# Patient Record
Sex: Male | Born: 1965 | Race: White | Hispanic: No | Marital: Married | Smoking: Never smoker
Health system: Southern US, Community
[De-identification: ages and names within clinical notes are randomized; demographics above are authoritative.]

## PROBLEM LIST (undated history)

## (undated) DIAGNOSIS — I10 Essential (primary) hypertension: Secondary | ICD-10-CM

## (undated) DIAGNOSIS — E785 Hyperlipidemia, unspecified: Secondary | ICD-10-CM

## (undated) DIAGNOSIS — F419 Anxiety disorder, unspecified: Secondary | ICD-10-CM

## (undated) DIAGNOSIS — K648 Other hemorrhoids: Secondary | ICD-10-CM

## (undated) DIAGNOSIS — K219 Gastro-esophageal reflux disease without esophagitis: Secondary | ICD-10-CM

## (undated) DIAGNOSIS — IMO0002 Reserved for concepts with insufficient information to code with codable children: Secondary | ICD-10-CM

## (undated) DIAGNOSIS — K759 Inflammatory liver disease, unspecified: Secondary | ICD-10-CM

## (undated) DIAGNOSIS — N400 Enlarged prostate without lower urinary tract symptoms: Secondary | ICD-10-CM

## (undated) HISTORY — DX: Hyperlipidemia, unspecified: E78.5

## (undated) HISTORY — PX: OTHER SURGICAL HISTORY: SHX169

## (undated) HISTORY — DX: Benign prostatic hyperplasia without lower urinary tract symptoms: N40.0

## (undated) HISTORY — DX: Inflammatory liver disease, unspecified: K75.9

## (undated) HISTORY — DX: Essential (primary) hypertension: I10

## (undated) HISTORY — PX: HAND SURGERY: SHX662

## (undated) HISTORY — DX: Gastro-esophageal reflux disease without esophagitis: K21.9

## (undated) HISTORY — DX: Other hemorrhoids: K64.8

## (undated) HISTORY — PX: KNEE ARTHROSCOPY: SUR90

## (undated) HISTORY — DX: Anxiety disorder, unspecified: F41.9

## (undated) HISTORY — PX: TONSILLECTOMY: SUR1361

## (undated) HISTORY — DX: Reserved for concepts with insufficient information to code with codable children: IMO0002

---

## 2003-01-22 ENCOUNTER — Ambulatory Visit (HOSPITAL_COMMUNITY): Admission: RE | Admit: 2003-01-22 | Discharge: 2003-01-22 | Payer: Self-pay | Admitting: Family Medicine

## 2003-02-09 ENCOUNTER — Ambulatory Visit (HOSPITAL_COMMUNITY): Admission: RE | Admit: 2003-02-09 | Discharge: 2003-02-09 | Payer: Self-pay | Admitting: Internal Medicine

## 2006-04-22 ENCOUNTER — Ambulatory Visit: Payer: Self-pay | Admitting: Internal Medicine

## 2006-05-31 ENCOUNTER — Ambulatory Visit: Payer: Self-pay | Admitting: Internal Medicine

## 2006-06-07 ENCOUNTER — Ambulatory Visit: Payer: Self-pay | Admitting: Internal Medicine

## 2010-06-02 NOTE — Op Note (Signed)
NAME:  Benjamin Mcknight, Benjamin Mcknight                        ACCOUNT NO.:  [id number]   MEDICAL RECORD NO.:  [id number]                   PATIENT TYPE:  AMB   LOCATION:  DAY                                  FACILITY:  [facility]   PHYSICIAN:  R. Roetta Sessions, M.D.              DATE OF BIRTH:  1965-07-26   DATE OF PROCEDURE:  DATE OF DISCHARGE:                                 OPERATIVE REPORT   PROCEDURE:  Diagnostic esophagogastroduodenoscopy.   ENDOSCOPIST:  Gerrit Friends. Rourk, M.D.   INDICATIONS FOR PROCEDURE:  The patient is a 45 year old male with symptoms  consistent with gastroesophageal reflux disease.  He has had some  improvement with a variety of proton pump inhibitors.  He is more recently  on Zegerid 20 mg daily, but continues to have frequent breakthrough  symptoms.  The gallbladder ultrasound was negative.  He does not have any  alarm symptoms such as GI bleeding, odynophagia, dysphagia, early satiety,  or weight loss.  EGD is now being done to further evaluate his symptoms.  This approach has been discussed with the patient at length.  The potential  risks, benefits, and alternatives have been reviewed; questions answered.  Please see my dictated H&P for more information.   PROCEDURE NOTE:  O2 saturation, blood pressure, pulse and respirations were  monitored throughout the entirety procedure.  Conscious sedation: Versed 3  mg IV, Demerol 75 mg IV in divided doses.   INSTRUMENT:  Olympus video chip GIF-140 gastroscope.   FINDINGS:  Examination of the tubular esophagus revealed no mucosal  abnormalities.  There was specifically no evidence of esophagitis or  Barrett's esophagus.  The EG junction was somewhat patulous and very easily  traversed.   STOMACH:  The gastric cavity was empty.  It insufflated well with air.  A  thorough examination of the gastric mucosa including a retroflex view of the  proximal stomach and esophagogastric junction demonstrated no abnormalities.  The pylorus  was patent and easily traversed.   DUODENUM:  The bulb and the second portion revealed no abnormalities.   THERAPEUTIC/DIAGNOSTIC MANEUVERS:  None.   The patient tolerated the procedure well and was reacted in endoscopy.   IMPRESSION:  1. Normal esophageal mucosa.  2. Patulous EG junction and normal stomach, D1 and D2.   RECOMMENDATIONS:  Increased Zegerid to 20 mg orally b.i.d. before breakfast  and supper.  Will see him back in the office in 1 month.  If his reflux  symptoms are significantly improved at that time he will need esophageal  manometry coupled with an ambulatory esophageal pH probe study while on acid  suppression therapy.  Further recommendations to follow.      ___________________________________________  Benjamin Mcknight, M.D.   RMR/MEDQ  D:  02/09/2003  T:  02/09/2003  Job:  [id number]   cc:   Benjamin Mcknight, M.D.  [address]  [place]  [place] [address]  Fax: [phone]

## 2010-06-02 NOTE — Assessment & Plan Note (Signed)
[facility]                         GASTROENTEROLOGY OFFICE NOTE   NAME:Advani, LERRY CORDREY                     MRN:          [id number]  DATE:04/22/2006                            DOB:          16-Feb-1965    Mr. Benassi is a very nice 45 year old patient of Dr. Phillips Odor who is here  today to evaluate intermittent rectal bleeding.  For several years he  says off and on painless hematochezia usually with bowel movements.  Blood is on the toilet tissue and is small amount only on 1 or 2  occasions was there a significant amount of blood, but never more than a  tablespoon.  He works as a Medical illustrator.  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  He has had prostatitis  apparently attributed to him sitting in the car a lot.  He also was  evaluated for gastroesophageal reflux by Dr. Jena Gauss in 2005 with findings  of hiatal hernia.  He has been on PPI Protonix 2 or 3 times a week.  As  far as his bowel habits are concerned, he has a bowel movement every  morning, sometimes rather hard.  He has to force.  He has never had any  hemorrhoids, but was diagnosed with rectal tissue by Dr. Sherwood Gambler several  years ago.  He was treated with suppositories and bleedings and fissure  subsided.  There is no family history of inflammatory bowel disease,  colon cancer or colon polyps.  In the last several months he has had 2  episodes of MRSA related perirectal abscess which was drained by Dr.  Sharlet Salina T. Hoxworth.  Etiology really is not clear, he is well now.  Not having any rectal pain.  His weight has been stable.  He has had  intentional weight loss about 5 pounds recently.   MEDICATIONS:  1. Bisoprolol.  2. HCTZ 5/6.25 mg once daily.  3. Protonix 40 mg 3 times a week.  4. Simvastatin 10 mg p.o. nightly.  5. Fish oil.  6. __________ 1 daily.   PAST MEDICAL HISTORY:  High blood pressure, high cholesterol, anxiety,  history of  prostatitis and MRSA related to anal abscess.   OPERATIONS:  Tongue surgery 1980 and tonsillectomy.  He also had left  knee surgery in 1992.   FAMILY HISTORY:  Negative for Crohn's disease or colon cancer.   SOCIAL HISTORY:  Married with 2 children.  Works in Airline pilot.  He is a  Engineer, maintenance (IT).  He drinks alcohol socially, but does not smoke.   REVIEW OF SYSTEMS:  Positive for recent fever.   ALLERGIES:  Skin rashes, hearing problems and swollen glands.   PHYSICAL EXAMINATION:  Blood pressure 122/62, pulse 80 and weight 178  pounds.  He appears healthy, no distress.  LUNGS:  Clear to auscultation.  NECK:  Supple without adenopathy.  HEART:  Quiet pericardium, normal S1, normal S2.  ABDOMEN:  Soft, nontender with normal active bowel sounds, good muscular  support. Liver edge was at costal margin.  RECTAL AND ANOSCOPIC:  Shows normal perianal area.  I  could not detect  any scar tissue from the recently drained perianal abscess.  Rectal tone  was increased, I think that the patient was somewhat anxious.  Anal  cannel itself was normal.  There was very prominent dentate line with an  overgrown papillae with some small bluish hemorrhoids proximal to the  dentate line, but 2 or 3 of them were identified, but none of them were  bleeding.  There was no evidence of fissure, although the dentate line  was somewhat irregular.  There was no stool to check for hemoccult, no  other lesions were noted.   IMPRESSION:  A 45 year old white male with intermittent rectal bleeding  with small hemorrhoids and a history of anal fissure.  He has no risk  factors for colon cancer.  The bleeding seems to be associated with hard  bowel movements.  I think he is very low risk for colon cancer or for  colon polyps.  Since he is only 76, his colorectal screening is not  mandatory.   PLAN:  I have discussed 2 options with the patient, one would be to go  ahead and schedule colonoscopy to rule out bleeding  proximal to his  rectal sigmoid.  The other plan would be to go ahead and treat him for  his small hemorrhoids, soften his stools, make sure he drinks a lot of  liquids and wait about 6-8 weeks treating with Anusol-HC suppositories.  If the bleeding continues despite of these modifications he will call us  to schedule direct colonoscopy.  If the bleeding stops, I think he ought  to have a colonoscopy when he is 45 years old.     Hedwig Morton. Juanda Chance, MD  Electronically Signed    DMB/MedQ  DD: 04/22/2006  DT: 04/22/2006  Job #: [id number]   cc:   Corrie Mckusick, M.D.

## 2010-06-02 NOTE — Consult Note (Signed)
NAME:  Benjamin Mcknight, ROMULUS                          ACCOUNT NO.:  [id number]   MEDICAL RECORD NO.:  [id number]                  PATIENT TYPE:   LOCATION:                                       FACILITY:   PHYSICIAN:  R. Roetta Sessions, M.D.              DATE OF BIRTH:  08-08-1965   DATE OF CONSULTATION:  02/04/2003  DATE OF DISCHARGE:                                   CONSULTATION   GASTROINTESTINAL CONSULTATION   REQUESTING PHYSICIAN:  Corrie Mckusick, M.D.   REASON FOR CONSULTATION:  Persistent GERD.   HISTORY OF PRESENT ILLNESS:  This is a 45 year old Caucasian gentleman who  has a history of chronic gastroesophageal reflux disease.  He underwent EGD  about 12 years ago while living in [place], [place] and reports an  unremarkable study.  Six years ago, however, he states that he had an upper  GI series which revealed a moderate sized hiatal hernia.  At that time he  was also having similar symptoms that he is now. He was on a PPI for several  months, but was able to come off medication and did fairly well up until a  couple of years ago.  For the last 2 years he has been on Aciphex 20 mg  daily.  Two months ago he started having severe right __________ symptoms.  He complains of chest pain in the substernal region associated with  indigestion.  He feels like there is a pressure in his chest often relieved  by belching.  He denies any dysphagia or odynophagia.  His appetite has been  good.  He denies any abdominal pain.  At one point he was on Aciphex and  Prevacid together, but this did not control his symptoms.  He had positive  H. pylori serologies and underwent treatment.  For the last 1 month he has  been on Zegerid with no noted improvement either.  He tends to have severe  symptoms every 3 to 4 days.  He occasionally does have some nocturnal  symptoms.  Symptoms seem to be worse if he has an empty stomach.  He denies  any constipation, diarrhea, melena or rectal  bleeding.  He had an abdominal  ultrasound on January 22, 2003 which was negative.   CURRENT MEDICATIONS:  1. Zegerid 20 mg daily.  2. Ziac 5 mg daily.  3. Saw palmetto 4 daily   ALLERGIES:  No known drug allergies.   PAST MEDICAL HISTORY:  1. Hypertension.  2. History of hiatal hernia.   PAST SURGICAL HISTORY:  1. Left knee arthroscopy.  2. Tonsillectomy.  3. Right lung surgery.   FAMILY HISTORY:  Negative for chronic GI illnesses or colorectal cancer.   SOCIAL HISTORY:  Married for 12 years has 2 children.  He is self-employed,  Chief Strategy Officer.  He has never been a smoker.  He has had no  alcohol in the last 4 weeks;  however, previously drank about 3-5 beers on  the weekends.   REVIEW OF SYSTEMS:  Please see HPI for GI.  GENERAL:  Denies any weight  loss.  CARDIOPULMONARY:  Denies any shortness of breath or exertional chest  pain.  No diaphoresis or palpitations   PHYSICAL EXAMINATION:  VITAL SIGNS:  Weight 184-1/2.  Height 6 feet.  Temperature 97.9, blood pressure 140/70, pulse 70.  GENERAL:  A pleasant, well-nourished, well-developed, Caucasian male in no  acute distress.  SKIN:  Warm and dry.  No jaundice.  HEENT:  Conjunctivae are pink.  Sclerae anicteric. Oropharyngeal mucosa  moist and pink.  No lesions, erythema or exudate.  No lymphadenopathy or  thyromegaly.  CHEST:  Lungs are clear to auscultation.  CARDIOVASCULAR:  Cardiac exam reveals regular rate and rhythm.  Normal S1,  S2.  No murmurs, rubs, or gallops.  ABDOMEN:  Positive bowel sounds, soft, nontender, nondistended.  No  organomegaly or masses.  EXTREMITIES:  No edema.   IMPRESSION:  This is a 45 year old gentleman with chronic gastroesophageal  reflux disease with persistent break through symptoms over the last 2  months.  He has failed several proton pump inhibitor therapies including  Aciphex, Prevacid and Zegerid.  Previously symptoms were well controlled on  Aciphex, however.  I have  offered him upper endoscopy at this time to  further evaluate persistent GERD symptoms.  I discussed risks, alternatives  and benefits with the patient; and he is agreeable to proceed.  He also has  a history of Helicobacter pylori and underwent treatment about 6 weeks ago.   PLAN:  1. EGD in the near future.  2. Continue Zegerid for now.  3. Further recommendations to follow.     ________________________________________  ___________________________________________  Tana Coast, P.AJonathon Bellows, M.D.   LL/MEDQ  D:  02/04/2003  T:  02/04/2003  Job:  [id number]   cc:   R. Roetta Sessions, M.D.  [address]  [place]  [place] [address]  Fax: [phone]   Corrie Mckusick, M.D.  [address]  [place]  [place] [address]  Fax: [phone]

## 2011-01-15 ENCOUNTER — Other Ambulatory Visit (HOSPITAL_COMMUNITY): Payer: Self-pay | Admitting: Family Medicine

## 2011-01-15 DIAGNOSIS — M549 Dorsalgia, unspecified: Secondary | ICD-10-CM

## 2011-01-17 ENCOUNTER — Ambulatory Visit (HOSPITAL_COMMUNITY)
Admission: RE | Admit: 2011-01-17 | Discharge: 2011-01-17 | Disposition: A | Discharge status: Home / Self Care | Payer: 59 | Source: Ambulatory Visit | Attending: Family Medicine | Admitting: Family Medicine

## 2011-01-17 DIAGNOSIS — M5126 Other intervertebral disc displacement, lumbar region: Secondary | ICD-10-CM | POA: Insufficient documentation

## 2011-01-17 DIAGNOSIS — M5137 Other intervertebral disc degeneration, lumbosacral region: Secondary | ICD-10-CM | POA: Insufficient documentation

## 2011-01-17 DIAGNOSIS — M545 Low back pain, unspecified: Secondary | ICD-10-CM | POA: Insufficient documentation

## 2011-01-17 DIAGNOSIS — M549 Dorsalgia, unspecified: Secondary | ICD-10-CM

## 2011-01-17 DIAGNOSIS — M51379 Other intervertebral disc degeneration, lumbosacral region without mention of lumbar back pain or lower extremity pain: Secondary | ICD-10-CM | POA: Insufficient documentation

## 2011-03-16 ENCOUNTER — Telehealth: Payer: Self-pay | Admitting: Internal Medicine

## 2011-03-16 NOTE — Telephone Encounter (Signed)
Spoke with patient and he states he started having rectal bleeding about 4 weeks ago. He saw his PCP and was given suppositories and Proctofoam. He used this for 2 weeks and the bleeding stopped. He was off of the medications for a week and now the bleeding has returned. He states he had a bowel movement  Yesterday and had bright, red blood on the tissue. No blood on the stool that he saw. Patient saw Dr. Juanda Chance in 2008 for similar symptoms. Scheduled patient with Mike Gip, PA on 03/19/11 at 8:30 AM.

## 2011-03-16 NOTE — Telephone Encounter (Signed)
I agree with him seeing Amy PA on 03/19/2011

## 2011-03-16 NOTE — Telephone Encounter (Signed)
Left a message for patient to call me. 

## 2011-03-19 ENCOUNTER — Ambulatory Visit (INDEPENDENT_AMBULATORY_CARE_PROVIDER_SITE_OTHER): Payer: 59 | Admitting: Physician Assistant

## 2011-03-19 ENCOUNTER — Encounter: Payer: Self-pay | Admitting: Physician Assistant

## 2011-03-19 ENCOUNTER — Encounter: Payer: Self-pay | Admitting: Internal Medicine

## 2011-03-19 VITALS — BP 120/80 | HR 64 | Ht 72.0 in | Wt 174.4 lb

## 2011-03-19 DIAGNOSIS — N400 Enlarged prostate without lower urinary tract symptoms: Secondary | ICD-10-CM

## 2011-03-19 DIAGNOSIS — I10 Essential (primary) hypertension: Secondary | ICD-10-CM

## 2011-03-19 DIAGNOSIS — K625 Hemorrhage of anus and rectum: Secondary | ICD-10-CM

## 2011-03-19 DIAGNOSIS — K219 Gastro-esophageal reflux disease without esophagitis: Secondary | ICD-10-CM

## 2011-03-19 MED ORDER — PEG-KCL-NACL-NASULF-NA ASC-C 100 G PO SOLR: ORAL | Status: DC

## 2011-03-19 NOTE — Patient Instructions (Signed)
Continue the rectal suppositories at bedtime. We have scheduled the colonoscopy with Dr. Lina Sar. Directions and brochure provided.  We faxed a prescription to [facility] for the colonoscopy prep. We gave you the prescription in case the pharmacy didn't get our fax.

## 2011-03-20 ENCOUNTER — Encounter: Payer: Self-pay | Admitting: Physician Assistant

## 2011-03-20 DIAGNOSIS — K219 Gastro-esophageal reflux disease without esophagitis: Secondary | ICD-10-CM | POA: Insufficient documentation

## 2011-03-20 DIAGNOSIS — N400 Enlarged prostate without lower urinary tract symptoms: Secondary | ICD-10-CM | POA: Insufficient documentation

## 2011-03-20 DIAGNOSIS — I10 Essential (primary) hypertension: Secondary | ICD-10-CM | POA: Insufficient documentation

## 2011-03-20 NOTE — Progress Notes (Signed)
Reviewed and agree with management. Robert D. Kaplan, M.D., FACG  

## 2011-03-20 NOTE — Progress Notes (Signed)
Subjective:    Patient ID: Benjamin Mcknight, male    DOB: 04/21/1965, 46 y.o.   MRN: [id number]  HPI Benjamin Mcknight is a pleasant 46 H. old white male known to Dr. Lina Sar . He has history of BPH, hypertension ,and hyperlipidemia. He has prior history of perirectal abscesses x2 in 2008. He last had colonoscopy in 2008 which was a normal exam he was noted to have some prominent anal papillae. He had had rectal bleeding prior to that exam was felt to be secondary to anorectal irritation.    He comes in today stating that intermittently he has had scant rectal bleeding with a small amount of bright red blood noted on the tissue after a hard stool, but about 3-4 weeks ago started having  heavier rectal bleeding fairly consistently with his bowel movements. He says he has seen streaks of blood on the stool over a several-day period. He says these episodes seem to last 7-8 days and then resolve and will recur. He says he gets anxious about the bleeding and then starts having some gas and bloating and admits that he is worried. He has not noted any changes in his bowel habits he has no complaints of abdominal pain and no rectal pain. 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 He had been taking some Advil and Aleve on a regular basis prior to getting a back injection in about a month ago but has not been on any more recent anti-inflammatories.    Review of Systems  Constitutional: Negative.   HENT: Negative.   Eyes: Negative.   Respiratory: Negative.   Cardiovascular: Negative.   Gastrointestinal: Positive for blood in stool.  Genitourinary: Negative.   Musculoskeletal: Negative.   Neurological: Negative.   Hematological: Negative.   Psychiatric/Behavioral: Negative.    Outpatient Encounter Prescriptions as of 03/19/2011  Medication Sig Dispense Refill  . alfuzosin (UROXATRAL) 10 MG 24 hr tablet Take 10 mg by mouth daily.      .  bisoprolol-hydrochlorothiazide (ZIAC) 5-6.25 MG per tablet Take 1 tablet by mouth daily.      . fish oil-omega-3 fatty acids 1000 MG capsule Take 2 g by mouth daily.      Marland Kitchen ibuprofen (ADVIL,MOTRIN) 200 MG tablet Take 200 mg by mouth every 6 (six) hours as needed.      . pantoprazole (PROTONIX) 40 MG tablet Take 40 mg by mouth daily.      . Saw Palmetto, Serenoa repens, (SAW PALMETTO PO) Take 1 tablet by mouth daily.      . simvastatin (ZOCOR) 10 MG tablet Take 10 mg by mouth at bedtime.      . peg 3350 powder (MOVIPREP) 100 G SOLR Take as directed.  Moviprep  1 kit  0    Allergies  Allergen Reactions  . Demerol Nausea Only       Patient Active Problem List  Diagnoses  . BPH (benign prostatic hyperplasia)  . HTN (hypertension)  . GERD (gastroesophageal reflux disease)    Objective:   Physical Exam well-developed white male in no acute distress, pleasant, blood pressure 120/80 pulse 64. HEENT; nontraumatic, normocephalic, EOMI ,PERRLA sclera anicteric, Neck; Supple no JVD, Cardiovascular; regular rate and rhythm with S1-S2 no murmur gallop, Pulmonary; clear bilaterally, Abdomen; soft, nontender, bowel sounds are active there is no palpable mass or hepatosplenomegaly, Rectal; no external hemorrhoids or lesions noted stool is brown and currently Hemoccult negative no  hemorrhoids or fissure appreciated. Extremities; no clubbing cyanosis or edema skin warm and dry, Psych; mood and affect normal and  Appropriate.        Assessment & Plan:  #47 46 year old white male with recent hematochezia with bright red blood noted with bowel movements, recurrent. Etiology of his bleeding is not clear, he did have a negative colonoscopy 5 years ago. Cannot rule out occult lesion, polyp, proctitis at this time.  Plan; Will schedule for a colonoscopy with Dr. Juanda Chance , procedure discussed in detail with the patient and he is agreeable to proceed Start Anusol-HC suppositories in the interim each bedtime.

## 2011-03-27 ENCOUNTER — Ambulatory Visit (AMBULATORY_SURGERY_CENTER): Payer: 59 | Admitting: Internal Medicine

## 2011-03-27 ENCOUNTER — Encounter: Payer: Self-pay | Admitting: Internal Medicine

## 2011-03-27 VITALS — BP 126/81 | HR 73 | Temp 99.0°F | Resp 18 | Ht 72.0 in | Wt 174.0 lb

## 2011-03-27 DIAGNOSIS — K625 Hemorrhage of anus and rectum: Secondary | ICD-10-CM

## 2011-03-27 DIAGNOSIS — K648 Other hemorrhoids: Secondary | ICD-10-CM

## 2011-03-27 MED ORDER — SODIUM CHLORIDE 0.9 % IV SOLN: 500.0000 mL | INTRAVENOUS | Status: DC

## 2011-03-27 NOTE — Patient Instructions (Signed)

## 2011-03-27 NOTE — Progress Notes (Signed)
Patient did not experience any of the following events: a burn prior to discharge; a fall within the facility; wrong site/side/patient/procedure/implant event; or a hospital transfer or hospital admission upon discharge from the facility. (G8907) Patient did not have preoperative order for IV antibiotic SSI prophylaxis. (G8918)  

## 2011-03-27 NOTE — Op Note (Signed)
[facility] [address] [place], [place]  [address]  COLONOSCOPY PROCEDURE REPORT  PATIENT:  Mylo, Choi  MR#:  [id number] BIRTHDATE:  [birth year], 46 yrs. old  GENDER:  male ENDOSCOPIST:  Hedwig Morton. Juanda Chance, MD REF. BY:  Assunta Found, M.D. PROCEDURE DATE:  03/27/2011 PROCEDURE:  Colonoscopy 19147 ASA CLASS:  Class II INDICATIONS:  last colon 05/2006, hx of perirectal abcess rectal bleeding improved on Anusol HC supp MEDICATIONS:   MAC sedation, administered by CRNA, propofol (Diprivan) 350 mg  DESCRIPTION OF PROCEDURE:   After the risks and benefits and of the procedure were explained, informed consent was obtained. Digital rectal exam was performed and revealed no rectal masses. The [id number] [id number] endoscope was introduced through the anus and advanced to the cecum, which was identified by both the appendix and ileocecal valve.  The quality of the prep was good, using MoviPrep.  The instrument was then slowly withdrawn as the colon was fully examined. <<PROCEDUREIMAGES>>  FINDINGS:  Internal Hemorrhoids were found (see image2, image1, and image7). small mixed hems  This was otherwise a normal examination of the colon (see image6, image5, image4, and image3). Retroflexed views in the rectum revealed no abnormalities.    The scope was then withdrawn from the patient and the procedure completed.  COMPLICATIONS:  None ENDOSCOPIC IMPRESSION: 1) Internal hemorrhoids 2) Otherwise normal examination RECOMMENDATIONS: 1) High fiber diet. Anusol HC supp prn recurrwent bleeding, use Miralax prn  REPEAT EXAM:  In 10 year(s) for.  ______________________________ Hedwig Morton. Juanda Chance, MD  CC:  n. eSIGNED:   Hedwig Morton. Finley Dinkel at 03/27/2011 02:13 PM  Fayne Mediate, [phone]

## 2011-03-28 ENCOUNTER — Telehealth: Payer: Self-pay | Admitting: *Deleted

## 2011-03-28 NOTE — Telephone Encounter (Signed)
  Follow up Call-  Call back number 03/27/2011  Post procedure Call Back phone  # Soledad Gerlach - wife  Permission to leave phone message Yes     Patient questions:     Left message on voicemail with caller id. For f/u callback

## 2013-02-02 ENCOUNTER — Encounter: Payer: Self-pay | Admitting: Physician Assistant

## 2013-02-02 ENCOUNTER — Ambulatory Visit (INDEPENDENT_AMBULATORY_CARE_PROVIDER_SITE_OTHER): Payer: BC Managed Care – PPO | Admitting: Physician Assistant

## 2013-02-02 VITALS — BP 142/80 | HR 68 | Ht 72.0 in | Wt 177.8 lb

## 2013-02-02 DIAGNOSIS — K5900 Constipation, unspecified: Secondary | ICD-10-CM

## 2013-02-02 MED ORDER — RESTORA PO CAPS: 1.0000 | ORAL_CAPSULE | Freq: Every day | ORAL | Status: DC

## 2013-02-02 NOTE — Progress Notes (Signed)
Subjective:    Patient ID: Benjamin Mcknight, male    DOB: 08/11/1965, 48 y.o.   MRN: [id number]  HPI  Benjamin Mcknight is a very nice 48 year old white male known to Dr. Olevia Perches he has history of perirectal abscess in 2008, hypertension and GERD. He had undergone colonoscopy in March of 2013 for screening and was noted to have internal hemorrhoids otherwise negative exam. Patient comes in today with concerns about change in his bowel habits with constipation. He says she's been having symptoms over the past 4-5 months and had just noticed passing smaller amounts of stool the stool having bowel movements every day. He says he has a sensation that he is not evacuating or emptying his bowel. He had seen his primary care physician in the interim and was started on MiraLax on a daily basis for 2 weeks and says that that worked fairly well but he seemed to have an increase in gas. He stopped the MiraLax about 5 days ago and says that he has been having bowel movements at least twice a day but still feels that he's not passing a normal amount of stool. He has no complaints of abdominal pain no melena or hematochezia. His appetite has been good his weight has been stable. Family history is negative for colon cancer or polyps. He states he has not been on any new medications ,vitamin supplements ,and has not made any changes in his diet.    Review of Systems  Constitutional: Negative.   HENT: Negative.   Eyes: Negative.   Respiratory: Negative.   Cardiovascular: Negative.   Gastrointestinal: Positive for constipation.  Endocrine: Negative.   Genitourinary: Negative.   Musculoskeletal: Negative.   Skin: Negative.   Allergic/Immunologic: Negative.   Neurological: Negative.   Hematological: Negative.   Psychiatric/Behavioral: Negative.    Outpatient Prescriptions Prior to Visit  Medication Sig Dispense Refill  . alfuzosin (UROXATRAL) 10 MG 24 hr tablet Take 10 mg by mouth daily.      .  bisoprolol-hydrochlorothiazide (ZIAC) 5-6.25 MG per tablet Take 1 tablet by mouth daily.      . fish oil-omega-3 fatty acids 1000 MG capsule Take 1 g by mouth daily.       . pantoprazole (PROTONIX) 40 MG tablet Take 40 mg by mouth daily.      . Saw Palmetto, Serenoa repens, (SAW PALMETTO PO) Take 2 tablets by mouth daily.       . simvastatin (ZOCOR) 10 MG tablet Take 10 mg by mouth at bedtime.      Marland Kitchen ibuprofen (ADVIL,MOTRIN) 200 MG tablet Take 200 mg by mouth every 6 (six) hours as needed.       No facility-administered medications prior to visit.   Allergies  Allergen Reactions  . Demerol Nausea Only       Patient Active Problem List   Diagnosis Date Noted  . BPH (benign prostatic hyperplasia) 03/20/2011  . HTN (hypertension) 03/20/2011  . GERD (gastroesophageal reflux disease) 03/20/2011   History  Substance Use Topics  . Smoking status: Never Smoker   . Smokeless tobacco: Never Used  . Alcohol Use: No   family history includes Hypertension in his father, maternal grandfather, maternal grandmother, and mother. There is no history of Colon cancer, Esophageal cancer, Rectal cancer, or Stomach cancer.  Objective:   Physical Exam  well-developed white male in no acute distress, pleasant blood pressure 142/80 pulse 68 height 6 foot weight 177. HEENT; nontraumatic normocephalic EOMI PERRLA sclera anicteric, Supple; no JVD, Cardiovascular;  regular rate and rhythm with S1-S2 no murmur or gallop, Pulmonary ;clear bilaterally, Abdomen; soft nontender nondistended bowel sounds are active there is no palpable mass or hepatosplenomegaly, Rectal; exam not done, Extremities ;no clubbing cyanosis or edema skin warm and dry, Psych; mood and affect appropriate        Assessment & Plan:  #74  48 year old male with mild constipation, suspect functional and probable underlying IBS with complaints of bloating and gas. Patient had negative colonoscopy March 2013  #2 chronic GERD controlled with  Protonix  Plan; start a probiotic on a daily basis. Have given him samples of Crestor a period of acid take a probiotic 42 months and then may alternate. Add Benefiber one dose on a daily basis. Will try to avoid prescription laxatives at this time, patient is asked to call back in 2 weeks if his symptoms have not improved

## 2013-02-02 NOTE — Patient Instructions (Signed)
We have given you samples of Restora.  Take one daily.  Take Probiotic for 2 months. You can get this at your pharmacy. Add Benefiber daily. Call back in 2 weeks if not improved.

## 2013-02-02 NOTE — Progress Notes (Signed)
Reviewed, there must be a typographical error in #2 assessment part, please correct. Thanx

## 2013-02-24 ENCOUNTER — Telehealth: Payer: Self-pay | Admitting: Physician Assistant

## 2013-02-24 NOTE — Telephone Encounter (Signed)
Left a message for patient to call me. 

## 2013-02-24 NOTE — Telephone Encounter (Signed)
Back on benefiber and probiotic...as seemed to be helping

## 2013-02-24 NOTE — Telephone Encounter (Signed)
Patient calling with update on condition. He reports he is more regular with bowel movements. Still has gas, burping and bloating. He stopped his Benefiber and probiotics about a week ago and has noticed an increase in these symptoms.

## 2013-02-25 NOTE — Telephone Encounter (Signed)
Patient given recommendations. Patient wants Nicoletta Ba, PA to know most of his gas is in upper abdomen.

## 2013-04-30 ENCOUNTER — Encounter: Payer: Self-pay | Admitting: *Deleted

## 2013-05-28 ENCOUNTER — Encounter: Payer: Self-pay | Admitting: Internal Medicine

## 2013-05-28 ENCOUNTER — Ambulatory Visit (INDEPENDENT_AMBULATORY_CARE_PROVIDER_SITE_OTHER): Payer: BC Managed Care – PPO | Admitting: Internal Medicine

## 2013-05-28 VITALS — BP 116/68 | HR 68 | Ht 71.26 in | Wt 178.0 lb

## 2013-05-28 DIAGNOSIS — K589 Irritable bowel syndrome without diarrhea: Secondary | ICD-10-CM

## 2013-05-28 NOTE — Progress Notes (Signed)
Benjamin Mcknight 23-May-1965 [id number]  Note: This dictation was prepared with Dragon digital system. Any transcriptional errors that result from this procedure are unintentional.   History of Present Illness:  This is a 48 year old white male with a recent change in bowel habits. He has had intermittent discomfort in the left upper and middle quadrant and feelings of incomplete evacuation. Probiotics and Metamucil have helped. He denies rectal bleeding or weight loss. 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 He eats out a lot. A colonoscopy in March 2013 showed internal hemorrhoids. He underwent drainage  Of a  rectal abscess in 2008 which has not bothered him. He has dyspepsia for which he takes Protonix 40 mg daily. An upper abdominal ultrasound in January 2005 for dyspepsia was negative. He has had prostatitis which is followed by a urologist. Patient denies excessive stress.    Past Medical History  Diagnosis Date  . Anxiety   . GERD (gastroesophageal reflux disease)   . Hepatitis   . Hyperlipemia   . Hypertension   . Bulging disc   . Internal hemorrhoids     Past Surgical History  Procedure Laterality Date  . Tonsillectomy    . Hand surgery      right thumb  . Knee arthroscopy Left   . Back injection      Allergies  Allergen Reactions  . Demerol Nausea Only    Family history and social history have been reviewed.  Review of Systems: Denies dysphagia weight loss rectal bleeding  The remainder of the 10 point ROS is negative except as outlined in the H&P  Physical Exam: General Appearance Well developed, in no distress Eyes  Non icteric  HEENT  Non traumatic, normocephalic  Mouth No lesion, tongue papillated, no cheilosis Neck Supple without adenopathy, thyroid not enlarged, no carotid bruits, no JVD Lungs Clear to auscultation bilaterally COR Normal S1, normal S2, regular rhythm, no murmur, quiet precordium Abdomen relaxed abdomen with  normal active bowel sounds. No distention. No tympany. No tenderness. Rectal not done Extremities  No pedal edema Skin No lesions Neurological Alert and oriented x 3 Psychological Normal mood and affect  Assessment and Plan:   Problem #1 vague symptoms suggestive of irritable bowel syndrome. We have discussed increasing his fiber intake. In fact, he will double up on Metamucil to 2 teaspoons or tablespoons daily. We have also discussed various forms of probiotics such as activia yogurt. I have offered the antispasmodic, Levsin sublingually for discomfort in the left lower quadrant but he declined medication. He is up-to-date on his colonoscopy. He does not have any worrisome symptoms that would necessitate CT imaging. He will come back if symptoms progress or continue.    [place] Dragon 05/28/2013

## 2013-05-28 NOTE — Patient Instructions (Signed)
You will be due for a recall colonoscopy in 03/2016. We will send you a reminder in the mail when it gets closer to that time.  CC:Dr Sharilyn Sites

## 2014-08-20 ENCOUNTER — Encounter: Payer: Self-pay | Admitting: Internal Medicine

## 2015-01-07 ENCOUNTER — Encounter: Payer: Self-pay | Admitting: Gastroenterology

## 2015-01-07 ENCOUNTER — Ambulatory Visit (INDEPENDENT_AMBULATORY_CARE_PROVIDER_SITE_OTHER): Payer: BLUE CROSS/BLUE SHIELD | Admitting: Gastroenterology

## 2015-01-07 VITALS — BP 150/84 | HR 71 | Ht 71.26 in | Wt 178.4 lb

## 2015-01-07 DIAGNOSIS — K219 Gastro-esophageal reflux disease without esophagitis: Secondary | ICD-10-CM | POA: Diagnosis not present

## 2015-01-07 DIAGNOSIS — R194 Change in bowel habit: Secondary | ICD-10-CM

## 2015-01-07 NOTE — Patient Instructions (Signed)
Thank you for choosing [facility] to care for you.  

## 2015-01-07 NOTE — Progress Notes (Signed)
HPI :  49 y/o male former patient of Dr. Olevia Perches, new to me, here for follow up.   He has a history of symptoms of incomplete evacuation with bowel movements. He reports this has bothered him intermittently over the past few years. He reports it can last for a few weeks at a time. He usually has one BM per day when feeling well. He reports he has been having a few BMs per morning otherwise when he has symptoms, small volume stools, sense of incomplete evacuation. He denies any signfiicant abdominal pains with this, perhaps some increased gas and some bloating. No blood in the stools that he is noting. Weight is stable. No FH of CRC. Eating well, good appetite. No postprandial symptoms. Stool form seems okay, does not strain.   He has tried eating high fiber diet, trying to increase water intake. He has been on metamucil in the past which did not help too much. He has tried Miralax previously which he thinks did not help too much, taking it once daily. He has also tried taking stool softeners. He has been on protonix for history of reflux and has a hiatal hernia. He first had been using it PRN, and then over time had been taking it daily. He has stopped this for about 4 weeks and has not had much reflux.   He has had 2 prior colonoscopies in our system as listed, and a remote flex sig.  Colonoscopy 2013 - normal, hemorrhoids Colonoscopy 2005 - normal  He otherwise reports a history of "hepatitis" as a child, unclear of details of this. He has had blood work recently per his primary care but we don't have the details of this in our system.   Past Medical History  Diagnosis Date  . Anxiety   . GERD (gastroesophageal reflux disease)   . Hepatitis   . Hyperlipemia   . Hypertension   . Bulging disc   . Internal hemorrhoids      Past Surgical History  Procedure Laterality Date  . Tonsillectomy    . Hand surgery      right thumb  . Knee arthroscopy Left   . Back injection     Family  History  Problem Relation Age of Onset  . Hypertension Mother   . Hypertension Father   . Hypertension Maternal Grandfather   . Hypertension Maternal Grandmother   . Colon cancer Neg Hx   . Esophageal cancer Neg Hx   . Rectal cancer Neg Hx   . Stomach cancer Neg Hx    Social History  Substance Use Topics  . Smoking status: Never Smoker   . Smokeless tobacco: Never Used  . Alcohol Use: No   Current Outpatient Prescriptions  Medication Sig Dispense Refill  . alfuzosin (UROXATRAL) 10 MG 24 hr tablet Take 10 mg by mouth daily.    . bisoprolol-hydrochlorothiazide (ZIAC) 5-6.25 MG per tablet Take 1 tablet by mouth daily.    . fish oil-omega-3 fatty acids 1000 MG capsule Take 1 g by mouth daily.     . pantoprazole (PROTONIX) 40 MG tablet Take 40 mg by mouth daily.    . Saw Palmetto, Serenoa repens, (SAW PALMETTO PO) Take 2 tablets by mouth daily.     . simvastatin (ZOCOR) 10 MG tablet Take 10 mg by mouth at bedtime.     No current facility-administered medications for this visit.   Allergies  Allergen Reactions  . Demerol Nausea Only     Review of Systems:  All systems reviewed and negative except where noted in HPI.   No labs available to review in Epic.  Physical Exam: BP 150/84 mmHg  Pulse 71  Ht 5' 11.26" (1.81 m)  Wt 178 lb 6.4 oz (80.922 kg)  BMI 24.70 kg/m2 Constitutional: Pleasant,well-developed, male in no acute distress. HEENT: Normocephalic and atraumatic. Conjunctivae are normal. No scleral icterus. Neck supple.  Cardiovascular: Normal rate, regular rhythm.  Pulmonary/chest: Effort normal and breath sounds normal. No wheezing, rales or rhonchi. Abdominal: Soft, nondistended, nontender. Bowel sounds active throughout. There are no masses palpable. No hepatomegaly. Extremities: no edema Lymphadenopathy: No cervical adenopathy noted. Neurological: Alert and oriented to person place and time. Skin: Skin is warm and dry. No rashes noted. Psychiatric: Normal mood  and affect. Behavior is normal.   ASSESSMENT AND PLAN: 49 y/o male with history of internal hemorrhoids, presenting with a few years of intermittent sense of incomplete evacuation during bowel movements. He has had a prior colonoscopy in 2013 and 2005 without any adenomas or mass lesions noted. I reassured him it is very unlikely he has colon cancer or a polyp driving this symptom, and suspect he more likely has a functional change in his bowel. That being said, he has had recent blood work per his primary care and will obtain these results (CBC, TSH, etc) to ensure normal. Otherwise, he has been on fiber supplements without any improvement in symptoms. I discussed other regimens we could try for him, with the side effect of causing diarrhea. Recommend he increase miralax to BID and titrate up as needed and see if this helps. We otherwise discussed perhaps changing position of his legs with a bowel movement, elevating them using a step stool, to help him relieve himself more completely. If these changes don't help at all and he continues having a hard time evacuating, we can consider a flex sig to provide reassurance of no missed lesions on his prior colonoscopy to account for this. We may also consider anorectal manometry to rule out pelvic floor dyssnergia, which is possible, although less common in men. He agreed and will follow up as needed for this issue if no improvement.   Otherwise, for GERD therapy can take PPI PRN, he does not need to use it routinely any more. Follow up as needed for this issue.   [place] Cellar, MD [facility] Pager [phone]  CC: Sharilyn Sites, MD

## 2015-01-26 ENCOUNTER — Encounter: Payer: Self-pay | Admitting: Gastroenterology

## 2015-01-26 ENCOUNTER — Telehealth: Payer: Self-pay | Admitting: Gastroenterology

## 2015-01-26 NOTE — Telephone Encounter (Signed)
Patient sent patient advise request. He states he is "status quo." see patient advise request.

## 2015-01-27 ENCOUNTER — Telehealth: Payer: Self-pay | Admitting: *Deleted

## 2015-01-27 DIAGNOSIS — R194 Change in bowel habit: Secondary | ICD-10-CM

## 2015-01-27 NOTE — Telephone Encounter (Signed)
Spoke with patient and scheduled pre visit on 02/03/15 at 10:00 AM and flex sig on 02/11/15 at 10:30 AM.

## 2015-01-27 NOTE — Telephone Encounter (Signed)
-----   Message from Manus Gunning, MD sent at 01/26/2015  5:05 PM EST ----- Rollene Fare can you please call this patient and coordinate a flex sig? I have been emailing with him about it and he wishes to proceed. Thanks

## 2015-02-03 ENCOUNTER — Ambulatory Visit (AMBULATORY_SURGERY_CENTER): Payer: Self-pay

## 2015-02-03 VITALS — Ht 72.0 in | Wt 176.8 lb

## 2015-02-03 DIAGNOSIS — R194 Change in bowel habit: Secondary | ICD-10-CM

## 2015-02-03 NOTE — Progress Notes (Signed)
No allergies to eggs or soy No diet/weight loss meds No home oxygen No past problems with anesthesia except Demerol with cons sed  Has email and internet; refused emmi

## 2015-02-11 ENCOUNTER — Encounter: Payer: Self-pay | Admitting: Gastroenterology

## 2015-02-11 ENCOUNTER — Ambulatory Visit (AMBULATORY_SURGERY_CENTER): Payer: BLUE CROSS/BLUE SHIELD | Admitting: Gastroenterology

## 2015-02-11 VITALS — BP 119/77 | HR 58 | Temp 96.3°F | Resp 18 | Ht 72.0 in | Wt 176.0 lb

## 2015-02-11 DIAGNOSIS — R194 Change in bowel habit: Secondary | ICD-10-CM

## 2015-02-11 MED ORDER — SODIUM CHLORIDE 0.9 % IV SOLN: 500.0000 mL | INTRAVENOUS | Status: DC

## 2015-02-11 NOTE — Op Note (Signed)
[place]  [address] [place], [id number]   FLEXIBLE SIGMOIDOSCOPY PROCEDURE REPORT  PATIENT: Benjamin, Mcknight  MR#: [id number] BIRTHDATE: Apr 02, 1965 , 16  yrs. old GENDER: male ENDOSCOPIST: Yetta Flock, MD REFERRED BY: PROCEDURE DATE:  02/11/2015 PROCEDURE:   Sigmoidoscopy, diagnostic ASA CLASS:   Class II INDICATIONS:sense of incomplete evacuation. MEDICATIONS: Propofol 170 mg IV  DESCRIPTION OF PROCEDURE:   After the risks benefits and alternatives of the procedure were thoroughly explained, informed consent was obtained.  Digital exam revealed no abnormalities of the rectum. The LB [id number]  endoscope was introduced through the anus  and advanced to the descending colon , The exam was Without limitations.    The quality of the prep was The overall prep quality was adequate. . Estimated blood loss is zero unless otherwise noted in this procedure report. The instrument was then slowly withdrawn as the mucosa was fully examined.      COLON FINDINGS: The colonic mucosa appeared normal.  Extent reached was distal descending colon.  No polyps or mass lesions.  The rectum was normal without pathology to account for the patient's symptoms.    Retroflexed views revealed small internal hemorrhoids and hypertrophied anal papillae.    The scope was then withdrawn from the patient and the procedure terminated.  COMPLICATIONS: There were no immediate complications.  ENDOSCOPIC IMPRESSION: Normal exam without clear pathology to explain the patient's symptoms Small internal hemorrhoids  RECOMMENDATIONS: Resume diet Resume medications Recommend anorectal manometry to ensure normal if the patient's symptoms persist    eSigned:  Yetta Flock, MD 02/11/2015 11:40 AM   CC: the patient

## 2015-02-11 NOTE — Patient Instructions (Signed)
Small internal hemorrhoids, otherwise normal.    YOU HAD AN ENDOSCOPIC PROCEDURE TODAY AT [place]:   Refer to the procedure report that was given to you for any specific questions about what was found during the examination.  If the procedure report does not answer your questions, please call your gastroenterologist to clarify.  If you requested that your care partner not be given the details of your procedure findings, then the procedure report has been included in a sealed envelope for you to review at your convenience later.  YOU SHOULD EXPECT: Some feelings of bloating in the abdomen. Passage of more gas than usual.  Walking can help get rid of the air that was put into your GI tract during the procedure and reduce the bloating. If you had a lower endoscopy (such as a colonoscopy or flexible sigmoidoscopy) you may notice spotting of blood in your stool or on the toilet paper. If you underwent a bowel prep for your procedure, you may not have a normal bowel movement for a few days.  Please Note:  You might notice some irritation and congestion in your nose or some drainage.  This is from the oxygen used during your procedure.  There is no need for concern and it should clear up in a day or so.  SYMPTOMS TO REPORT IMMEDIATELY:   Following lower endoscopy (colonoscopy or flexible sigmoidoscopy):  Excessive amounts of blood in the stool  Significant tenderness or worsening of abdominal pains  Swelling of the abdomen that is new, acute  Fever of 100F or higher   For urgent or emergent issues, a gastroenterologist can be reached at any hour by calling [phone].   DIET: Your first meal following the procedure should be a small meal and then it is ok to progress to your normal diet. Heavy or fried foods are harder to digest and may make you feel nauseous or bloated.  Likewise, meals heavy in dairy and vegetables can increase bloating.  Drink plenty of fluids but you  should avoid alcoholic beverages for 24 hours.  ACTIVITY:  You should plan to take it easy for the rest of today and you should NOT DRIVE or use heavy machinery until tomorrow (because of the sedation medicines used during the test).    FOLLOW UP: Our staff will call the number listed on your records the next business day following your procedure to check on you and address any questions or concerns that you may have regarding the information given to you following your procedure. If we do not reach you, we will leave a message.  However, if you are feeling well and you are not experiencing any problems, there is no need to return our call.  We will assume that you have returned to your regular daily activities without incident.  If any biopsies were taken you will be contacted by phone or by letter within the next 1-3 weeks.  Please call us at [phone] if you have not heard about the biopsies in 3 weeks.    SIGNATURES/CONFIDENTIALITY: You and/or your care partner have signed paperwork which will be entered into your electronic medical record.  These signatures attest to the fact that that the information above on your After Visit Summary has been reviewed and is understood.  Full responsibility of the confidentiality of this discharge information lies with you and/or your care-partner.

## 2015-02-11 NOTE — Progress Notes (Signed)
To recovery, report to Scott, RN, VSS 

## 2015-02-14 ENCOUNTER — Telehealth: Payer: Self-pay

## 2015-02-14 NOTE — Telephone Encounter (Signed)
Left a message at [phone] for the pt to callus back if any questions or concerns. maw

## 2016-02-20 ENCOUNTER — Encounter: Payer: Self-pay | Admitting: Gastroenterology

## 2017-11-06 ENCOUNTER — Ambulatory Visit: Payer: Managed Care, Other (non HMO)

## 2017-11-06 ENCOUNTER — Other Ambulatory Visit (HOSPITAL_COMMUNITY): Payer: Self-pay | Admitting: Family Medicine

## 2017-11-06 ENCOUNTER — Ambulatory Visit (HOSPITAL_COMMUNITY)
Admission: RE | Admit: 2017-11-06 | Discharge: 2017-11-06 | Disposition: A | Discharge status: Home / Self Care | Payer: Managed Care, Other (non HMO) | Source: Ambulatory Visit | Attending: Family Medicine | Admitting: Family Medicine

## 2017-11-06 DIAGNOSIS — N10 Acute tubulo-interstitial nephritis: Secondary | ICD-10-CM

## 2017-11-06 DIAGNOSIS — N342 Other urethritis: Secondary | ICD-10-CM

## 2017-11-06 DIAGNOSIS — R319 Hematuria, unspecified: Secondary | ICD-10-CM | POA: Diagnosis not present

## 2018-03-31 ENCOUNTER — Ambulatory Visit (INDEPENDENT_AMBULATORY_CARE_PROVIDER_SITE_OTHER): Payer: Managed Care, Other (non HMO) | Admitting: Otolaryngology

## 2018-09-16 ENCOUNTER — Other Ambulatory Visit: Payer: Self-pay

## 2018-09-16 DIAGNOSIS — Z20822 Contact with and (suspected) exposure to covid-19: Secondary | ICD-10-CM

## 2018-09-16 NOTE — Progress Notes (Signed)
[id number] 

## 2018-09-17 LAB — NOVEL CORONAVIRUS, NAA: SARS-CoV-2, NAA: NOT DETECTED

## 2019-01-22 ENCOUNTER — Other Ambulatory Visit: Payer: Self-pay

## 2019-01-22 ENCOUNTER — Ambulatory Visit: Payer: Managed Care, Other (non HMO) | Attending: Internal Medicine

## 2019-01-22 DIAGNOSIS — Z20822 Contact with and (suspected) exposure to covid-19: Secondary | ICD-10-CM

## 2019-01-24 LAB — NOVEL CORONAVIRUS, NAA: SARS-CoV-2, NAA: DETECTED — AB

## 2019-09-08 ENCOUNTER — Other Ambulatory Visit: Payer: Managed Care, Other (non HMO)

## 2019-09-08 ENCOUNTER — Other Ambulatory Visit: Payer: Self-pay

## 2019-09-08 DIAGNOSIS — Z20822 Contact with and (suspected) exposure to covid-19: Secondary | ICD-10-CM

## 2019-09-09 LAB — NOVEL CORONAVIRUS, NAA: SARS-CoV-2, NAA: NOT DETECTED

## 2019-09-09 LAB — SARS-COV-2, NAA 2 DAY TAT

## 2020-03-15 ENCOUNTER — Ambulatory Visit: Payer: Managed Care, Other (non HMO) | Admitting: Dermatology

## 2020-03-15 ENCOUNTER — Other Ambulatory Visit: Payer: Self-pay

## 2020-03-15 ENCOUNTER — Encounter: Payer: Self-pay | Admitting: Dermatology

## 2020-03-15 DIAGNOSIS — B360 Pityriasis versicolor: Secondary | ICD-10-CM

## 2020-03-15 DIAGNOSIS — L820 Inflamed seborrheic keratosis: Secondary | ICD-10-CM

## 2020-03-15 DIAGNOSIS — Z1283 Encounter for screening for malignant neoplasm of skin: Secondary | ICD-10-CM

## 2020-03-15 DIAGNOSIS — D485 Neoplasm of uncertain behavior of skin: Secondary | ICD-10-CM

## 2020-03-15 DIAGNOSIS — Z86018 Personal history of other benign neoplasm: Secondary | ICD-10-CM

## 2020-03-15 MED ORDER — FLUCONAZOLE 100 MG PO TABS: ORAL_TABLET | ORAL | 0 refills | Status: DC

## 2020-03-15 NOTE — Patient Instructions (Signed)

## 2020-03-24 ENCOUNTER — Encounter: Payer: Self-pay | Admitting: Dermatology

## 2020-03-24 NOTE — Progress Notes (Signed)
   Follow-Up Visit   Subjective  Benjamin Mcknight is a 55 y.o. male who presents for the following: Follow-up (Follow up on fungi on back per patient it was a 3 step medication that he would have to take every 30 days, pharmacy couldn't get him his third dose in time. Did not finish treatment that Dr. Denna Haggard had given./Red spot on left collarbone that he would like checked, was getting bigger and aggravating, but as started going away./).  A general skin exam nation, possible new spot left inner shoulder, review options for tinea versicolor. Location:  Duration:  Quality:  Associated Signs/Symptoms: Modifying Factors:  Severity:  Timing: Context:   Objective  Well appearing patient in no apparent distress; mood and affect are within normal limits. Objective  Head to Waist: Head to waist no signs of atypical moles, or melanoma.  Objective  Left Axilla, Left Lower Back, Right Abdomen: No sign repigmentation.  Objective  Left Shoulder - Anterior: Waxy pink 6 mm crust, rule out superficial carcinoma       Objective  Left Upper Back, Right Upper Back: Moderately extensive subtle branny pink scale typical of tinea versicolor.  Again reviewed the etiology of this noncontagious fungus.    All skin waist up examined.   Assessment & Plan    Skin exam for malignant neoplasm Head to Waist  Annual skin check, patient encouraged to self examine twice annually with spouse.  Continue ultraviolet protection.  History of atypical skin mole (3) Right Abdomen; Left Axilla; Left Lower Back  Recheck as needed.  Neoplasm of uncertain behavior of skin Left Shoulder - Anterior  Skin / nail biopsy Type of biopsy: tangential   Informed consent: discussed and consent obtained   Timeout: patient name, date of birth, surgical site, and procedure verified   Procedure prep:  Patient was prepped and draped in usual sterile fashion (Non sterile) Prep type:  Chlorhexidine Anesthesia:  the lesion was anesthetized in a standard fashion   Anesthetic:  1% lidocaine w/ epinephrine 1-100,000 local infiltration Instrument used: flexible razor blade   Hemostasis achieved with: ferric subsulfate   Outcome: patient tolerated procedure well   Post-procedure details: sterile dressing applied and wound care instructions given   Dressing type: bandage and petrolatum    Specimen 1 - Surgical pathology Differential Diagnosis: R/O BCC vs SCC  Check Margins: No  Tinea versicolor (2) Left Upper Back; Right Upper Back  Fluconazole 100 mg, 2 pills every 2 weeks for 3 doses.  Over-the-counter 1% ketoconazole shampoo may be used once weekly to minimize recurrence.  Follow-up by telephone in 2 months.  Ordered Medications: fluconazole (DIFLUCAN) 100 MG tablet      I, Lavonna Monarch, MD, have reviewed all documentation for this visit.  The documentation on 03/24/20 for the exam, diagnosis, procedures, and orders are all accurate and complete.

## 2020-04-25 ENCOUNTER — Telehealth: Payer: Managed Care, Other (non HMO) | Admitting: Physician Assistant

## 2020-04-25 DIAGNOSIS — G8929 Other chronic pain: Secondary | ICD-10-CM | POA: Diagnosis not present

## 2020-04-25 DIAGNOSIS — M5441 Lumbago with sciatica, right side: Secondary | ICD-10-CM | POA: Diagnosis not present

## 2020-04-25 MED ORDER — NAPROXEN 500 MG PO TABS: 500.0000 mg | ORAL_TABLET | Freq: Two times a day (BID) | ORAL | 0 refills | Status: AC

## 2020-04-25 MED ORDER — CYCLOBENZAPRINE HCL 10 MG PO TABS: 10.0000 mg | ORAL_TABLET | Freq: Three times a day (TID) | ORAL | 0 refills | Status: AC | PRN

## 2020-04-25 NOTE — Progress Notes (Signed)
We are sorry that you are not feeling well.  Here is how we plan to help!  Based on what you have shared with me it looks like you mostly have acute back pain with sciatica.  Acute back pain is defined as musculoskeletal pain that can resolve in 1-3 weeks with conservative treatment.  I have prescribed Naprosyn 500 mg take one by mouth twice a day non-steroid anti-inflammatory (NSAID) as well as Flexeril 10 mg every eight hours as needed which is a muscle relaxer  Some patients experience stomach irritation or in increased heartburn with anti-inflammatory drugs.  Please keep in mind that muscle relaxer's can cause fatigue and should not be taken while at work or driving.  Back pain is very common.  The pain often gets better over time.  The cause of back pain is usually not dangerous.  Most people can learn to manage their back pain on their own.  Home Care  Stay active.  Start with short walks on flat ground if you can.  Try to walk farther each day.  Do not sit, drive or stand in one place for more than 30 minutes.  Do not stay in bed.  Do not avoid exercise or work.  Activity can help your back heal faster.  Be careful when you bend or lift an object.  Bend at your knees, keep the object close to you, and do not twist.  Sleep on a firm mattress.  Lie on your side, and bend your knees.  If you lie on your back, put a pillow under your knees.  Only take medicines as told by your doctor.  Put ice on the injured area.  Put ice in a plastic bag  Place a towel between your skin and the bag  Leave the ice on for 15-20 minutes, 3-4 times a day for the first 2-3 days. 210 After that, you can switch between ice and heat packs.  Ask your doctor about back exercises or massage.  Avoid feeling anxious or stressed.  Find good ways to deal with stress, such as exercise.  Get Help Right Way If:  Your pain does not go away with rest or medicine.  Your pain does not go away in 1 week.  You  have new problems.  You do not feel well.  The pain spreads into your legs.  You cannot control when you poop (bowel movement) or pee (urinate)  You feel sick to your stomach (nauseous) or throw up (vomit)  You have belly (abdominal) pain.  You feel like you may pass out (faint).  If you develop a fever.  Make Sure you:  Understand these instructions.  Will watch your condition  Will get help right away if you are not doing well or get worse.  Your e-visit answers were reviewed by a board certified advanced clinical practitioner to complete your personal care plan.  Depending on the condition, your plan could have included both over the counter or prescription medications.  If there is a problem please reply  once you have received a response from your provider.  Your safety is important to [place].  If you have drug allergies check your prescription carefully.    You can use MyChart to ask questions about today's visit, request a non-urgent call back, or ask for a work or school excuse for 24 hours related to this e-Visit. If it has been greater than 24 hours you will need to follow up with your provider, or  enter a new e-Visit to address those concerns.  You will get an e-mail in the next two days asking about your experience.  I hope that your e-visit has been valuable and will speed your recovery. Thank you for using e-visits.  I provided 6 minutes of non face-to-face time during this encounter for chart review and documentation.

## 2020-06-09 ENCOUNTER — Other Ambulatory Visit: Payer: Self-pay | Admitting: Neurological Surgery

## 2020-06-09 DIAGNOSIS — M5416 Radiculopathy, lumbar region: Secondary | ICD-10-CM

## 2020-06-14 ENCOUNTER — Ambulatory Visit
Admission: RE | Admit: 2020-06-14 | Discharge: 2020-06-14 | Disposition: A | Discharge status: Home / Self Care | Payer: Managed Care, Other (non HMO) | Source: Ambulatory Visit | Attending: Neurological Surgery | Admitting: Neurological Surgery

## 2020-06-14 ENCOUNTER — Other Ambulatory Visit: Payer: Self-pay

## 2020-06-14 DIAGNOSIS — M5416 Radiculopathy, lumbar region: Secondary | ICD-10-CM

## 2020-06-14 MED ORDER — IOPAMIDOL (ISOVUE-M 200) INJECTION 41%
1.0000 mL | Freq: Once | INTRAMUSCULAR | Status: AC
  Administered 2020-06-14: 1 mL via EPIDURAL

## 2020-06-14 MED ORDER — METHYLPREDNISOLONE ACETATE 40 MG/ML INJ SUSP (RADIOLOG
120.0000 mg | Freq: Once | INTRAMUSCULAR | Status: AC
  Administered 2020-06-14: 120 mg via EPIDURAL

## 2020-06-14 NOTE — Discharge Instructions (Signed)
Post Procedure Spinal Discharge Instruction Sheet  1. You may resume a regular diet and any medications that you routinely take (including pain medications).  2. No driving day of procedure.  3. Light activity throughout the rest of the day.  Do not do any strenuous work, exercise, bending or lifting.  The day following the procedure, you can resume normal physical activity but you should refrain from exercising or physical therapy for at least three days thereafter.   Common Side Effects:   Headaches- take your usual medications as directed by your physician.  Increase your fluid intake.  Caffeinated beverages may be helpful.  Lie flat in bed until your headache resolves.   Restlessness or inability to sleep- you may have trouble sleeping for the next few days.  Ask your referring physician if you need any medication for sleep.   Facial flushing or redness- should subside within a few days.   Increased pain- a temporary increase in pain a day or two following your procedure is not unusual.  Take your pain medication as prescribed by your referring physician.   Leg cramps  Please contact our office at [phone] for the following symptoms:  Fever greater than 100 degrees.  Headaches unresolved with medication after 2-3 days.  Increased swelling, pain, or redness at injection site.   Thank you for visiting [facility] today.  

## 2021-03-15 ENCOUNTER — Other Ambulatory Visit: Payer: Self-pay

## 2021-03-15 ENCOUNTER — Ambulatory Visit: Payer: Managed Care, Other (non HMO) | Admitting: Dermatology

## 2021-03-15 ENCOUNTER — Encounter: Payer: Self-pay | Admitting: Dermatology

## 2021-03-15 DIAGNOSIS — D1801 Hemangioma of skin and subcutaneous tissue: Secondary | ICD-10-CM

## 2021-03-15 DIAGNOSIS — Z1283 Encounter for screening for malignant neoplasm of skin: Secondary | ICD-10-CM

## 2021-03-15 DIAGNOSIS — L821 Other seborrheic keratosis: Secondary | ICD-10-CM

## 2021-03-15 DIAGNOSIS — L299 Pruritus, unspecified: Secondary | ICD-10-CM

## 2021-03-15 DIAGNOSIS — L851 Acquired keratosis [keratoderma] palmaris et plantaris: Secondary | ICD-10-CM

## 2021-03-15 MED ORDER — DOXEPIN HCL 5 % EX CREA: 1.0000 "application " | TOPICAL_CREAM | Freq: Every day | CUTANEOUS | 1 refills | Status: DC

## 2021-03-15 NOTE — Patient Instructions (Addendum)
Pramoxine otc Cera-Ve ?

## 2021-03-25 ENCOUNTER — Encounter: Payer: Self-pay | Admitting: Dermatology

## 2021-03-25 NOTE — Progress Notes (Signed)
? ?  Follow-Up Visit ?  ?Subjective  ?Benjamin Mcknight is a 56 y.o. male who presents for the following: Annual Exam (Pt states itching on the arms x 2 weeks. ). ? ?Itching on arms, skin check ?Location:  ?Duration:  ?Quality:  ?Associated Signs/Symptoms: ?Modifying Factors:  ?Severity:  ?Timing: ?Context:  ? ?Objective  ?Well appearing patient in no apparent distress; mood and affect are within normal limits. ?Left Forearm - Posterior, Right Forearm - Posterior ?Itching without visible rash or growth: Neurogenic itch (brachioradial pruritus) ? ?Left Upper Eyelid, Mid Frontal Scalp ?1 to 2 mm smooth red dermal papules ? ?Scalp ?Full body exam: No atypical pigmented spots.  Waxy flat pink 8 mm spot right infraclavicular; patient was not aware of this, so it may be inflammatory.  If it is not clear spontaneously in 10 weeks he should return for possible superficial carcinoma. ? ? ? ? ? ? ?All skin waist up examined. ? ? ?Assessment & Plan  ? ? ?Pruritus (2) ?Left Forearm - Posterior; Right Forearm - Posterior ? ?If over-the-counter antipruritic containing the ingredient pramoxine does not help, he can obtain topical generic Zonalon ( he should check out-of-pocket cost). ? ?Related Medications ?Doxepin HCl (ZONALON) 5 % CREA ?Apply 1 application topically daily. ? ?Cherry angioma (2) ?Left Upper Eyelid; Mid Frontal Scalp ? ?No intervention necessary ? ?Encounter for screening for malignant neoplasm of skin ?Scalp ? ?Annual skin examination. ? ?Seborrheic keratosis (3) ?Neck - Anterior; Right Forearm - Posterior; Right Parietal Scalp ? ?Stucco keratoses ?Left Lower Leg - Posterior ? ? ? ? ? ?I, Lavonna Monarch, MD, have reviewed all documentation for this visit.  The documentation on 03/25/21 for the exam, diagnosis, procedures, and orders are all accurate and complete. ?

## 2021-03-30 ENCOUNTER — Telehealth: Payer: Self-pay

## 2021-03-30 NOTE — Telephone Encounter (Signed)
Faxed received from patient's insurance stating that the prescription for the patient's Doxepin Cream is denied. Information given to Dr. Denna Haggard.  ?

## 2021-04-04 NOTE — Telephone Encounter (Signed)
Phone call to patient with Dr. Onalee Hua recommendations regarding his Doxepin Cream. I informed patient that we could get the cream compounded for him at [facility] and he would pay out of pocket for the cream anywhere from $50 to $70. Patient aware, patient states that he appreciates Dr. Denna Haggard working to get him this medication but he would like to hold off on getting the medication at this time.  ?

## 2021-04-12 ENCOUNTER — Other Ambulatory Visit (HOSPITAL_COMMUNITY): Payer: Self-pay | Admitting: Otolaryngology

## 2021-04-12 ENCOUNTER — Other Ambulatory Visit: Payer: Self-pay | Admitting: Otolaryngology

## 2021-04-12 DIAGNOSIS — J320 Chronic maxillary sinusitis: Secondary | ICD-10-CM

## 2021-05-01 ENCOUNTER — Ambulatory Visit (HOSPITAL_COMMUNITY)
Admission: RE | Admit: 2021-05-01 | Discharge: 2021-05-01 | Disposition: A | Discharge status: Home / Self Care | Payer: Managed Care, Other (non HMO) | Source: Ambulatory Visit | Attending: Otolaryngology | Admitting: Otolaryngology

## 2021-05-01 DIAGNOSIS — J320 Chronic maxillary sinusitis: Secondary | ICD-10-CM

## 2021-05-26 ENCOUNTER — Encounter: Payer: Self-pay | Admitting: Gastroenterology

## 2022-03-19 ENCOUNTER — Ambulatory Visit: Payer: Managed Care, Other (non HMO) | Admitting: Dermatology

## 2022-07-06 ENCOUNTER — Encounter: Payer: Self-pay | Admitting: Urology

## 2022-07-06 ENCOUNTER — Ambulatory Visit: Payer: Managed Care, Other (non HMO) | Admitting: Urology

## 2022-07-06 VITALS — BP 119/74 | HR 59

## 2022-07-06 DIAGNOSIS — R351 Nocturia: Secondary | ICD-10-CM | POA: Diagnosis not present

## 2022-07-06 DIAGNOSIS — N411 Chronic prostatitis: Secondary | ICD-10-CM | POA: Diagnosis not present

## 2022-07-06 DIAGNOSIS — N400 Enlarged prostate without lower urinary tract symptoms: Secondary | ICD-10-CM

## 2022-07-06 LAB — URINALYSIS, ROUTINE W REFLEX MICROSCOPIC
Bilirubin, UA: NEGATIVE
Glucose, UA: NEGATIVE
Leukocytes,UA: NEGATIVE
Nitrite, UA: NEGATIVE
RBC, UA: NEGATIVE
Specific Gravity, UA: 1.02 (ref 1.005–1.030)
Urobilinogen, Ur: 1 mg/dL (ref 0.2–1.0)
pH, UA: 6.5 (ref 5.0–7.5)

## 2022-07-06 LAB — BLADDER SCAN AMB NON-IMAGING: Scan Result: 0

## 2022-07-06 MED ORDER — DOXYCYCLINE HYCLATE 100 MG PO CAPS: 100.0000 mg | ORAL_CAPSULE | Freq: Two times a day (BID) | ORAL | 0 refills | Status: DC

## 2022-07-06 NOTE — Progress Notes (Unsigned)
07/06/2022 9:56 AM   Benjamin Mcknight 1965-04-24 [id number]  Referring provider: Assunta Found, MD [address] [place],  [place] [address]  nocturia   HPI: Benjamin Mcknight is a 57yo here for evaluation of BPH and nocturia. He was previously seen by Benjamin Mcknight for BPH and has been on uroxatral for the past 10 years. IPSS 7 QOL 3 on uroxatral. No straining to urinate. Urine stream fair. He has a hx of chronic prostatitis but has not had an issues since starting the uroxatral. PSA 0.4. He finished a course of levaquin for prostatitis which failed to improve his LUTS. No dysuria or hematuria.    PMH: Past Medical History:  Diagnosis Date   Anxiety    Bulging disc    GERD (gastroesophageal reflux disease)    Hepatitis    Hyperlipemia    Hypertension    Internal hemorrhoids     Surgical History: Past Surgical History:  Procedure Laterality Date   back injection     HAND SURGERY     right thumb   KNEE ARTHROSCOPY Left    TONSILLECTOMY      Home Medications:  Allergies as of 07/06/2022       Reactions   Demerol Nausea Only        Medication List        Accurate as of July 06, 2022  9:56 AM. If you have any questions, ask your nurse or doctor.          alfuzosin 10 MG 24 hr tablet Commonly known as: UROXATRAL Take 10 mg by mouth daily.   aspirin 81 MG tablet Take 81 mg by mouth daily.   bisoprolol-hydrochlorothiazide 5-6.25 MG tablet Commonly known as: ZIAC Take 1 tablet by mouth daily.   cyclobenzaprine 10 MG tablet Commonly known as: FLEXERIL Take 1 tablet (10 mg total) by mouth 3 (three) times daily as needed for muscle spasms.   Doxepin HCl 5 % Crea Commonly known as: Zonalon Apply 1 application topically daily.   fish oil-omega-3 fatty acids 1000 MG capsule Take 1 g by mouth daily.   fluconazole 100 MG tablet Commonly known as: DIFLUCAN Take two pills with food every 2 weeks for 3 weeks   naproxen 500 MG tablet Commonly known as:  NAPROSYN Take 1 tablet (500 mg total) by mouth 2 (two) times daily with a meal.   SAW PALMETTO PO Take 2 tablets by mouth daily.   simvastatin 10 MG tablet Commonly known as: ZOCOR Take 10 mg by mouth at bedtime.        Allergies:  Allergies  Allergen Reactions   Demerol Nausea Only    Family History: Family History  Problem Relation Age of Onset   Hypertension Mother    Hypertension Father    Hypertension Maternal Grandfather    Hypertension Maternal Grandmother    Colon cancer Neg Hx    Esophageal cancer Neg Hx    Rectal cancer Neg Hx    Stomach cancer Neg Hx     Social History:  reports that he has never smoked. He has never used smokeless tobacco. He reports current alcohol use of about 6.0 standard drinks of alcohol per week. He reports that he does not use drugs.  ROS: All other review of systems were reviewed and are negative except what is noted above in HPI  Physical Exam: BP 119/74   Pulse (!) 59   Constitutional:  Alert and oriented, No acute distress. HEENT: [place] AT, moist mucus  membranes.  Trachea midline, no masses. Cardiovascular: No clubbing, cyanosis, or edema. Respiratory: Normal respiratory effort, no increased work of breathing. GI: Abdomen is soft, nontender, nondistended, no abdominal masses GU: No CVA tenderness. Circumcised phallus. No masses/lesions on penis, testis, scrotum. Prostate 40g smooth no nodules. Left lobe mildly tender Lymph: No cervical or inguinal lymphadenopathy. Skin: No rashes, bruises or suspicious lesions. Neurologic: Grossly intact, no focal deficits, moving all 4 extremities. Psychiatric: Normal mood and affect.  Laboratory Data: No results found for: "WBC", "HGB", "HCT", "MCV", "PLT"  No results found for: "CREATININE"  No results found for: "PSA"  No results found for: "TESTOSTERONE"  No results found for: "HGBA1C"  Urinalysis No results found for: "COLORURINE", "APPEARANCEUR", "LABSPEC", "PHURINE",  "GLUCOSEU", "HGBUR", "BILIRUBINUR", "KETONESUR", "PROTEINUR", "UROBILINOGEN", "NITRITE", "LEUKOCYTESUR"  No results found for: "LABMICR", "WBCUA", "RBCUA", "LABEPIT", "MUCUS", "BACTERIA"  Pertinent Imaging:  No results found for this or any previous visit.  No results found for this or any previous visit.  No results found for this or any previous visit.  No results found for this or any previous visit.  No results found for this or any previous visit.  No valid procedures specified. No results found for this or any previous visit.  Results for orders placed during the hospital encounter of 11/06/17  CT RENAL STONE STUDY  Narrative CLINICAL DATA:  Hematuria with pelvic pain  EXAM: CT ABDOMEN AND PELVIS WITHOUT CONTRAST  TECHNIQUE: Multidetector CT imaging of the abdomen and pelvis was performed following the standard protocol without IV contrast.  COMPARISON:  None.  FINDINGS: Lower chest: Lung bases demonstrate no acute consolidation or effusion. The heart size is normal.  Hepatobiliary: Multiple subcentimeter hypodense lesions in the liver too small to further characterize. Possible sludge in the gallbladder. No calcified stone or biliary dilatation.  Pancreas: Unremarkable. No pancreatic ductal dilatation or surrounding inflammatory changes.  Spleen: Normal in size without focal abnormality.  Adrenals/Urinary Tract: Adrenal glands are normal. No hydronephrosis. The bladder is unremarkable  Stomach/Bowel: Stomach is within normal limits. Appendix appears normal. No evidence of bowel wall thickening, distention, or inflammatory changes.  Vascular/Lymphatic: Minimal aortic atherosclerosis. No aneurysmal dilatation. Subcentimeter retroperitoneal nodes.  Reproductive: Prostate is unremarkable.  Other: Negative for free air or free fluid.  Musculoskeletal: No acute or significant osseous findings.  IMPRESSION: 1. Negative for hydronephrosis or ureteral  stone. 2. Possible small amount of sludge in the gallbladder 3. No CT evidence for acute intra-abdominal or pelvic abnormality.   Electronically Signed By: Jasmine Pang M.D. On: 11/06/2017 17:29   Assessment & Plan:    1. Chronic prostatitis -doxycycline 100mg  BID for 28 days -continue uroxatral - Urinalysis, Routine w reflex microscopic - BLADDER SCAN AMB NON-IMAGING  2. Nocturia -continue uroxatral 10mg  qhs   No follow-ups on file.  Wilkie Aye, MD  [facility]

## 2022-07-12 ENCOUNTER — Encounter: Payer: Self-pay | Admitting: Urology

## 2022-07-12 NOTE — Patient Instructions (Signed)

## 2022-08-14 ENCOUNTER — Encounter: Payer: Self-pay | Admitting: Gastroenterology

## 2022-08-27 ENCOUNTER — Telehealth: Payer: Self-pay

## 2022-08-27 NOTE — Telephone Encounter (Signed)
Patient states after taking his abx he started having stomach issues.  He is having trouble with bloating and gas pains.  He started a probiotic and noticed this helped some.  He is asking if you have any other recommendations or if any other medication is recommended at this time.

## 2022-08-28 NOTE — Telephone Encounter (Signed)
Patient will follow up as scheduled with Dr. Ronne Binning on 08/30

## 2022-09-12 ENCOUNTER — Encounter: Payer: Self-pay | Admitting: Gastroenterology

## 2022-09-12 ENCOUNTER — Ambulatory Visit: Payer: Managed Care, Other (non HMO) | Admitting: Gastroenterology

## 2022-09-12 VITALS — BP 122/62 | HR 68 | Ht 71.0 in | Wt 174.0 lb

## 2022-09-12 DIAGNOSIS — R140 Abdominal distension (gaseous): Secondary | ICD-10-CM | POA: Diagnosis not present

## 2022-09-12 DIAGNOSIS — R194 Change in bowel habit: Secondary | ICD-10-CM | POA: Insufficient documentation

## 2022-09-12 DIAGNOSIS — Z1211 Encounter for screening for malignant neoplasm of colon: Secondary | ICD-10-CM | POA: Insufficient documentation

## 2022-09-12 MED ORDER — NA SULFATE-K SULFATE-MG SULF 17.5-3.13-1.6 GM/177ML PO SOLN: 1.0000 | Freq: Once | ORAL | 0 refills | Status: AC

## 2022-09-12 NOTE — Progress Notes (Signed)
09/12/2022 Benjamin Mcknight [id number] [birth year]   HISTORY OF PRESENT ILLNESS: This is a 57 year old male who is a patient of Dr. Lanetta Inch.  He is due for colonoscopy and is actually scheduled for that next month.  Recently has been following with his urologist, being treated for BPH and prostatitis.  He was given doxycycline for 28 days.  He has been off of that for about a month.  He has noticed that he has been having issues with being able to pass gas.  He says he feels very bloated.  He is going back and forth to the bathroom 3-4 times only passes a little bit of stool each time.  Says that he started Colace and MiraLAX a few days ago.  He has been taking a probiotic for about the past 3 weeks or so to try to replenish his good gut bacteria.  No rectal bleeding.  Past Medical History:  Diagnosis Date   Anxiety    BPH (benign prostatic hyperplasia)    Bulging disc    GERD (gastroesophageal reflux disease)    Hepatitis    Hyperlipemia    Hypertension    Internal hemorrhoids    Past Surgical History:  Procedure Laterality Date   back injection     HAND SURGERY     right thumb   KNEE ARTHROSCOPY Left    TONSILLECTOMY      reports that he has never smoked. He has never used smokeless tobacco. He reports current alcohol use of about 6.0 standard drinks of alcohol per week. He reports that he does not use drugs. family history includes Hypertension in his father, maternal grandfather, maternal grandmother, and mother. Allergies  Allergen Reactions   Demerol Nausea Only      Outpatient Encounter Medications as of 09/12/2022  Medication Sig   alfuzosin (UROXATRAL) 10 MG 24 hr tablet Take 10 mg by mouth daily.   bisoprolol-hydrochlorothiazide (ZIAC) 5-6.25 MG per tablet Take 1 tablet by mouth daily.   Doxepin HCl (ZONALON) 5 % CREA Apply 1 application topically daily.   fish oil-omega-3 fatty acids 1000 MG capsule Take 1 g by mouth daily.    Saw Palmetto, Serenoa repens,  (SAW PALMETTO PO) Take 2 tablets by mouth daily.    simvastatin (ZOCOR) 10 MG tablet Take 10 mg by mouth at bedtime.   vitamin C (ASCORBIC ACID) 250 MG tablet Take 250 mg by mouth daily.   aspirin 81 MG tablet Take 81 mg by mouth daily. (Patient not taking: Reported on 07/06/2022)   cyclobenzaprine (FLEXERIL) 10 MG tablet Take 1 tablet (10 mg total) by mouth 3 (three) times daily as needed for muscle spasms. (Patient not taking: Reported on 07/06/2022)   doxycycline (VIBRAMYCIN) 100 MG capsule Take 1 capsule (100 mg total) by mouth every 12 (twelve) hours. (Patient not taking: Reported on 09/12/2022)   fluconazole (DIFLUCAN) 100 MG tablet Take two pills with food every 2 weeks for 3 weeks (Patient not taking: Reported on 07/06/2022)   naproxen (NAPROSYN) 500 MG tablet Take 1 tablet (500 mg total) by mouth 2 (two) times daily with a meal. (Patient not taking: Reported on 07/06/2022)   No facility-administered encounter medications on file as of 09/12/2022.    REVIEW OF SYSTEMS  : All other systems reviewed and negative except where noted in the History of Present Illness.   PHYSICAL EXAM: BP 122/62   Pulse 68   Ht 5\' 11"  (1.803 m)   Wt 174 lb (78.9 kg)  BMI 24.27 kg/m  General: Well developed white male in no acute distress Head: Normocephalic and atraumatic Eyes:  Sclerae anicteric, conjunctiva pink. Ears: Normal auditory acuity Lungs: Clear throughout to auscultation; no W/R/R. Heart: Regular rate and rhythm; no M/R/G. Abdomen: Soft, non-distended.  BS present.  Non-tender. Rectal:  Will be done at the time of colonoscopy. Musculoskeletal: Symmetrical with no gross deformities  Skin: No lesions on visible extremities Extremities: No edema  Neurological: Alert oriented x 4, grossly non-focal Psychological:  Alert and cooperative. Normal mood and affect  ASSESSMENT AND PLAN: *Change in bowel habits with bloating: Reports inability to pass gas.  Was on some antibiotics including  doxycycline for 28 days from his urologist for prostatitis.  He has been taking a probiotic to help replenish good gut bacteria and he can certainly continue that for now.  He is already scheduled for colonoscopy in September so we will obviously proceed with that.  In the interim we will have him do a MiraLAX bowel purge and then begin taking MiraLAX daily to try to keep things moving well.  The risks, benefits, and alternatives to colonoscopy were discussed with the patient and he consents to proceed.   CC:  Assunta Found, MD

## 2022-09-12 NOTE — Progress Notes (Signed)
Agree with assessment and plan as outlined.  

## 2022-09-12 NOTE — Patient Instructions (Addendum)
BOWEL PURGE:   OVER THE COUNTER SHOPPING GUIDE:   Purchase 1 (one) 119 GRAM bottle of Miralax Purchase 1 (one) 32 ounce bottle of Gatorade   STEPS:   Mix the entire bottle of Miralax in the 32 ounces of Gatorade and stir to dissolve completely. After 1 hour, drink the Miralax solution you have prepared. You will drink this mixture over the next 2-3 hours.  You should expect results within 1 to 6 hours after completing this bowel purge.  Start Miralax 1 capful daily in 8 ounces of liquid.  You have been scheduled for a colonoscopy. Please follow written instructions given to you at your visit today.   Please pick up your prep supplies at the pharmacy within the next 1-3 days.  If you use inhalers (even only as needed), please bring them with you on the day of your procedure.  DO NOT TAKE 7 DAYS PRIOR TO TEST- Trulicity (dulaglutide) Ozempic, Wegovy (semaglutide) Mounjaro (tirzepatide) Bydureon Bcise (exanatide extended release)  DO NOT TAKE 1 DAY PRIOR TO YOUR TEST Rybelsus (semaglutide) Adlyxin (lixisenatide) Victoza (liraglutide) Byetta (exanatide) _______________________________________________________  If your blood pressure at your visit was 140/90 or greater, please contact your primary care physician to follow up on this.  _______________________________________________________  If you are age 33 or older, your body mass index should be between 23-30. Your Body mass index is 24.27 kg/m. If this is out of the aforementioned range listed, please consider follow up with your Primary Care Provider.  If you are age 15 or younger, your body mass index should be between 19-25. Your Body mass index is 24.27 kg/m. If this is out of the aformentioned range listed, please consider follow up with your Primary Care Provider.   ________________________________________________________  The [facility] providers would like to encourage you to use [facility] to communicate with  providers for non-urgent requests or questions.  Due to long hold times on the telephone, sending your provider a message by [employer] may be a faster and more efficient way to get a response.  Please allow 48 business hours for a response.  Please remember that this is for non-urgent requests.  _______________________________________________________

## 2022-09-14 ENCOUNTER — Encounter: Payer: Self-pay | Admitting: Urology

## 2022-09-14 ENCOUNTER — Ambulatory Visit: Payer: Managed Care, Other (non HMO) | Admitting: Urology

## 2022-09-14 VITALS — BP 125/76 | HR 71

## 2022-09-14 DIAGNOSIS — N400 Enlarged prostate without lower urinary tract symptoms: Secondary | ICD-10-CM

## 2022-09-14 DIAGNOSIS — N401 Enlarged prostate with lower urinary tract symptoms: Secondary | ICD-10-CM | POA: Diagnosis not present

## 2022-09-14 DIAGNOSIS — R351 Nocturia: Secondary | ICD-10-CM

## 2022-09-14 LAB — URINALYSIS, ROUTINE W REFLEX MICROSCOPIC
Bilirubin, UA: NEGATIVE
Glucose, UA: NEGATIVE
Nitrite, UA: NEGATIVE
Protein,UA: NEGATIVE
RBC, UA: NEGATIVE
Specific Gravity, UA: 1.015 (ref 1.005–1.030)
Urobilinogen, Ur: 1 mg/dL (ref 0.2–1.0)
pH, UA: 7 (ref 5.0–7.5)

## 2022-09-14 LAB — MICROSCOPIC EXAMINATION
Bacteria, UA: NONE SEEN
RBC, Urine: NONE SEEN /hpf (ref 0–2)

## 2022-09-14 NOTE — Progress Notes (Signed)
09/14/2022 10:33 AM   Benjamin Mcknight 1965/01/31 [id number]  Referring provider: Assunta Found, MD [address] [place],  [place] [address]  Followup nocturia and chronic prostatitis   HPI: Benjamin Mcknight is a 57yo here for followup for chronic prostatitis and nocturia. Doxycycline failed to improve his LUTS. IPSS 6 QOL 3 on uroxatral 10mg  at bedtime. He has worsening constipation after finishing the doxycyline. He has started colace and miralax which has not improved his constipation.  Nocturia 2-3x. Urine stream intermittently weak. He notes the tadalafil has decreased effectiveness since the constipation worsened.    PMH: Past Medical History:  Diagnosis Date   Anxiety    BPH (benign prostatic hyperplasia)    Bulging disc    GERD (gastroesophageal reflux disease)    Hepatitis    Hyperlipemia    Hypertension    Internal hemorrhoids     Surgical History: Past Surgical History:  Procedure Laterality Date   back injection     HAND SURGERY     right thumb   KNEE ARTHROSCOPY Left    TONSILLECTOMY      Home Medications:  Allergies as of 09/14/2022       Reactions   Demerol Nausea Only        Medication List        Accurate as of September 14, 2022 10:33 AM. If you have any questions, ask your nurse or doctor.          alfuzosin 10 MG 24 hr tablet Commonly known as: UROXATRAL Take 10 mg by mouth daily.   aspirin 81 MG tablet Take 81 mg by mouth daily.   bisoprolol-hydrochlorothiazide 5-6.25 MG tablet Commonly known as: ZIAC Take 1 tablet by mouth daily.   cyclobenzaprine 10 MG tablet Commonly known as: FLEXERIL Take 1 tablet (10 mg total) by mouth 3 (three) times daily as needed for muscle spasms.   Doxepin HCl 5 % Crea Commonly known as: Zonalon Apply 1 application topically daily.   doxycycline 100 MG capsule Commonly known as: VIBRAMYCIN Take 1 capsule (100 mg total) by mouth every 12 (twelve) hours.   fish oil-omega-3 fatty acids 1000 MG  capsule Take 1 g by mouth daily.   fluconazole 100 MG tablet Commonly known as: DIFLUCAN Take two pills with food every 2 weeks for 3 weeks   naproxen 500 MG tablet Commonly known as: NAPROSYN Take 1 tablet (500 mg total) by mouth 2 (two) times daily with a meal.   SAW PALMETTO PO Take 2 tablets by mouth daily.   simvastatin 10 MG tablet Commonly known as: ZOCOR Take 10 mg by mouth at bedtime.   vitamin C 250 MG tablet Commonly known as: ASCORBIC ACID Take 250 mg by mouth daily.   Vitamin D (Ergocalciferol) 1.25 MG (50000 UNIT) Caps capsule Commonly known as: DRISDOL Take 50,000 Units by mouth once a week.        Allergies:  Allergies  Allergen Reactions   Demerol Nausea Only    Family History: Family History  Problem Relation Age of Onset   Hypertension Mother    Hypertension Father    Hypertension Maternal Grandmother    Hypertension Maternal Grandfather    Colon cancer Neg Hx    Esophageal cancer Neg Hx    Liver disease Neg Hx     Social History:  reports that he has never smoked. He has never used smokeless tobacco. He reports current alcohol use of about 6.0 standard drinks of alcohol per week. He  reports that he does not use drugs.  ROS: All other review of systems were reviewed and are negative except what is noted above in HPI  Physical Exam: BP 125/76   Pulse 71   Constitutional:  Alert and oriented, No acute distress. HEENT: [place] AT, moist mucus membranes.  Trachea midline, no masses. Cardiovascular: No clubbing, cyanosis, or edema. Respiratory: Normal respiratory effort, no increased work of breathing. GI: Abdomen is soft, nontender, nondistended, no abdominal masses GU: No CVA tenderness.  Lymph: No cervical or inguinal lymphadenopathy. Skin: No rashes, bruises or suspicious lesions. Neurologic: Grossly intact, no focal deficits, moving all 4 extremities. Psychiatric: Normal mood and affect.  Laboratory Data: No results found for: "WBC",  "HGB", "HCT", "MCV", "PLT"  No results found for: "CREATININE"  No results found for: "PSA"  No results found for: "TESTOSTERONE"  No results found for: "HGBA1C"  Urinalysis    Component Value Date/Time   APPEARANCEUR Clear 07/06/2022 0939   GLUCOSEU Negative 07/06/2022 0939   BILIRUBINUR Negative 07/06/2022 0939   PROTEINUR Trace 07/06/2022 0939   NITRITE Negative 07/06/2022 0939   LEUKOCYTESUR Negative 07/06/2022 0939    Lab Results  Component Value Date   LABMICR Comment 07/06/2022    Pertinent Imaging:  No results found for this or any previous visit.  No results found for this or any previous visit.  No results found for this or any previous visit.  No results found for this or any previous visit.  No results found for this or any previous visit.  No valid procedures specified. No results found for this or any previous visit.  Results for orders placed during the hospital encounter of 11/06/17  CT RENAL STONE STUDY  Narrative CLINICAL DATA:  Hematuria with pelvic pain  EXAM: CT ABDOMEN AND PELVIS WITHOUT CONTRAST  TECHNIQUE: Multidetector CT imaging of the abdomen and pelvis was performed following the standard protocol without IV contrast.  COMPARISON:  None.  FINDINGS: Lower chest: Lung bases demonstrate no acute consolidation or effusion. The heart size is normal.  Hepatobiliary: Multiple subcentimeter hypodense lesions in the liver too small to further characterize. Possible sludge in the gallbladder. No calcified stone or biliary dilatation.  Pancreas: Unremarkable. No pancreatic ductal dilatation or surrounding inflammatory changes.  Spleen: Normal in size without focal abnormality.  Adrenals/Urinary Tract: Adrenal glands are normal. No hydronephrosis. The bladder is unremarkable  Stomach/Bowel: Stomach is within normal limits. Appendix appears normal. No evidence of bowel wall thickening, distention, or inflammatory  changes.  Vascular/Lymphatic: Minimal aortic atherosclerosis. No aneurysmal dilatation. Subcentimeter retroperitoneal nodes.  Reproductive: Prostate is unremarkable.  Other: Negative for free air or free fluid.  Musculoskeletal: No acute or significant osseous findings.  IMPRESSION: 1. Negative for hydronephrosis or ureteral stone. 2. Possible small amount of sludge in the gallbladder 3. No CT evidence for acute intra-abdominal or pelvic abnormality.   Electronically Signed By: Jasmine Pang M.D. On: 11/06/2017 17:29   Assessment & Plan:    1. Enlarged prostate -continue uroxatral 10mg  at bedtime. If his LUTS fail to improve after addressing the constipation than we will likely proceed with rapaflo 8mg  qhs - Urinalysis, Routine w reflex microscopic  2. Nocturia -   No follow-ups on file.  Wilkie Aye, MD  [facility] [place]

## 2022-09-14 NOTE — Patient Instructions (Signed)

## 2022-09-19 ENCOUNTER — Encounter: Payer: Self-pay | Admitting: Urology

## 2022-09-19 ENCOUNTER — Telehealth: Payer: Self-pay | Admitting: Gastroenterology

## 2022-09-19 NOTE — Telephone Encounter (Signed)
See below, FYI

## 2022-09-19 NOTE — Telephone Encounter (Signed)
Benjamin Mcknight see the update from the pt regarding bowel purge and continued symptoms.

## 2022-09-19 NOTE — Telephone Encounter (Signed)
patient needing prep instructions sent due to being rescheduled for a sooner apt

## 2022-09-21 NOTE — Telephone Encounter (Signed)
No new instructions needed per patient.

## 2022-09-24 NOTE — Telephone Encounter (Signed)
Inbound call from patient wishing to clarify new prep times for 9/16 colonoscopy. Requesting a call back to discuss. Please advise, thank you.

## 2022-09-25 NOTE — Telephone Encounter (Signed)
New prep instructions sent to patient via MyChart. ?

## 2022-09-25 NOTE — Telephone Encounter (Signed)
Please advise 

## 2022-10-01 ENCOUNTER — Ambulatory Visit: Payer: Managed Care, Other (non HMO) | Admitting: Gastroenterology

## 2022-10-01 ENCOUNTER — Encounter: Payer: Self-pay | Admitting: Gastroenterology

## 2022-10-01 VITALS — BP 123/65 | HR 67 | Temp 98.9°F | Resp 18 | Ht 71.0 in | Wt 174.0 lb

## 2022-10-01 DIAGNOSIS — Z1211 Encounter for screening for malignant neoplasm of colon: Secondary | ICD-10-CM | POA: Diagnosis present

## 2022-10-01 DIAGNOSIS — D123 Benign neoplasm of transverse colon: Secondary | ICD-10-CM

## 2022-10-01 DIAGNOSIS — R194 Change in bowel habit: Secondary | ICD-10-CM | POA: Diagnosis not present

## 2022-10-01 MED ORDER — SODIUM CHLORIDE 0.9 % IV SOLN: 500.0000 mL | INTRAVENOUS | Status: DC

## 2022-10-01 NOTE — Progress Notes (Signed)
Pt resting comfortably. VSS. Airway intact. SBAR complete to RN. All questions answered.   

## 2022-10-01 NOTE — Progress Notes (Signed)
History and Physical Interval Note: see office note 09/12/22 for details. Scheduled for colonoscopy, last full exam 10 years ago. Has had some altered bowel habits since course of doxycycline a few months ago. Colonoscopy to further evaluate.  10/01/2022 1:49 PM  Benjamin Mcknight  has presented today for endoscopic procedure(s), with the diagnosis of  Encounter Diagnoses  Name Primary?   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  The various methods of evaluation and treatment have been discussed with the patient and/or family. After consideration of risks, benefits and other options for treatment, the patient has consented to  the endoscopic procedure(s).   The patient's history has been reviewed, patient examined, no change in status, stable for surgery.  I have reviewed the patient's chart and labs.  Questions were answered to the patient's satisfaction.    Harlin Rain, MD [facility]

## 2022-10-01 NOTE — Op Note (Signed)
[facility] Patient Name: Benjamin Mcknight Procedure Date: 10/01/2022 1:13 PM MRN: [id number] Endoscopist: Viviann Spare P. Adela Lank , MD, [id number] Age: 57 Referring MD:  Date of Birth: 08-Dec-1965 Gender: Male Account #: [id number] Procedure:                Colonoscopy Indications:              Change in bowel habits, colon cancer screening -                            last exam 03/2011 Medicines:                Monitored Anesthesia Care Procedure:                Pre-Anesthesia Assessment:                           - Prior to the procedure, a History and Physical                            was performed, and patient medications and                            allergies were reviewed. The patient's tolerance of                            previous anesthesia was also reviewed. The risks                            and benefits of the procedure and the sedation                            options and risks were discussed with the patient.                            All questions were answered, and informed consent                            was obtained. Prior Anticoagulants: The patient has                            taken no anticoagulant or antiplatelet agents. ASA                            Grade Assessment: II - A patient with mild systemic                            disease. After reviewing the risks and benefits,                            the patient was deemed in satisfactory condition to                            undergo the procedure.  After obtaining informed consent, the colonoscope                            was passed under direct vision. Throughout the                            procedure, the patient's blood pressure, pulse, and                            oxygen saturations were monitored continuously. The                            Olympus Scope [id number] was introduced through the                            anus and advanced to the the  terminal ileum, with                            identification of the appendiceal orifice and IC                            valve. The colonoscopy was performed without                            difficulty. The patient tolerated the procedure                            well. The quality of the bowel preparation was                            good. The terminal ileum, ileocecal valve,                            appendiceal orifice, and rectum were photographed. Scope In: 1:25:39 PM Scope Out: 1:43:16 PM Scope Withdrawal Time: 0 hours 11 minutes 55 seconds  Total Procedure Duration: 0 hours 17 minutes 37 seconds  Findings:                 The perianal and digital rectal examinations were                            normal.                           The terminal ileum appeared normal.                           The colon was tortuous. The left colon was                            restricted with a sharply angulated turn in the                            distal sigmoid colon which took some time to  traverse, slowly, with water immersion.                           A diminutive polyp was found in the hepatic                            flexure. The polyp was sessile. The polyp was                            removed with a cold snare. Resection and retrieval                            were complete.                           Internal hemorrhoids were found during                            retroflexion. The hemorrhoids were small.                           Anal papilla(e) were hypertrophied.                           The exam was otherwise without abnormality. Complications:            No immediate complications. Estimated blood loss:                            Minimal. Estimated Blood Loss:     Estimated blood loss was minimal. Impression:               - The examined portion of the ileum was normal.                           - Tortuous colon, sharply angulated turn  in the                            distal sigmoid colon.                           - One diminutive polyp at the hepatic flexure,                            removed with a cold snare. Resected and retrieved.                           - Internal hemorrhoids.                           - Anal papilla(e) were hypertrophied.                           - The examination was otherwise normal. Recommendation:           - Patient has a contact number available for  emergencies. The signs and symptoms of potential                            delayed complications were discussed with the                            patient. Return to normal activities tomorrow.                            Written discharge instructions were provided to the                            patient.                           - Resume previous diet.                           - Continue present medications.                           - Trial of Citrucel daily and IB gard as needed for                            symptoms                           - Await pathology results. Viviann Spare P. Hali Balgobin, MD 10/01/2022 1:49:17 PM This report has been signed electronically.

## 2022-10-01 NOTE — Patient Instructions (Signed)
Resume previous diet and medications. Awaiting pathology results. Repeat Colonoscopy date to be determined based on pathology results.  YOU HAD AN ENDOSCOPIC PROCEDURE TODAY AT THE [facility]:   Refer to the procedure report that was given to you for any specific questions about what was found during the examination.  If the procedure report does not answer your questions, please call your gastroenterologist to clarify.  If you requested that your care partner not be given the details of your procedure findings, then the procedure report has been included in a sealed envelope for you to review at your convenience later.  YOU SHOULD EXPECT: Some feelings of bloating in the abdomen. Passage of more gas than usual.  Walking can help get rid of the air that was put into your GI tract during the procedure and reduce the bloating. If you had a lower endoscopy (such as a colonoscopy or flexible sigmoidoscopy) you may notice spotting of blood in your stool or on the toilet paper. If you underwent a bowel prep for your procedure, you may not have a normal bowel movement for a few days.  Please Note:  You might notice some irritation and congestion in your nose or some drainage.  This is from the oxygen used during your procedure.  There is no need for concern and it should clear up in a day or so.  SYMPTOMS TO REPORT IMMEDIATELY:  Following lower endoscopy (colonoscopy or flexible sigmoidoscopy):  Excessive amounts of blood in the stool  Significant tenderness or worsening of abdominal pains  Swelling of the abdomen that is new, acute  Fever of 100F or higher  For urgent or emergent issues, a gastroenterologist can be reached at any hour by calling [phone]. Do not use MyChart messaging for urgent concerns.    DIET:  We do recommend a small meal at first, but then you may proceed to your regular diet.  Drink plenty of fluids but you should avoid alcoholic beverages for 24  hours.  ACTIVITY:  You should plan to take it easy for the rest of today and you should NOT DRIVE or use heavy machinery until tomorrow (because of the sedation medicines used during the test).    FOLLOW UP: Our staff will call the number listed on your records the next business day following your procedure.  We will call around 7:15- 8:00 am to check on you and address any questions or concerns that you may have regarding the information given to you following your procedure. If we do not reach you, we will leave a message.     If any biopsies were taken you will be contacted by phone or by letter within the next 1-3 weeks.  Please call us at [phone] if you have not heard about the biopsies in 3 weeks.    SIGNATURES/CONFIDENTIALITY: You and/or your care partner have signed paperwork which will be entered into your electronic medical record.  These signatures attest to the fact that that the information above on your After Visit Summary has been reviewed and is understood.  Full responsibility of the confidentiality of this discharge information lies with you and/or your care-partner.

## 2022-10-01 NOTE — Progress Notes (Signed)
Patient states there have been no changes to medical or surgical history since time of pre-visit. 

## 2022-10-01 NOTE — Progress Notes (Signed)
Called to room to assist during endoscopic procedure.  Patient ID and intended procedure confirmed with present staff. Received instructions for my participation in the procedure from the performing physician.  

## 2022-10-02 ENCOUNTER — Telehealth: Payer: Self-pay

## 2022-10-02 NOTE — Telephone Encounter (Signed)
  Follow up Call-     10/01/2022   12:49 PM  Call back number  Post procedure Call Back phone  # [phone]  Permission to leave phone message Yes     Patient questions:  Do you have a fever, pain , or abdominal swelling? No. Pain Score  0 *  Have you tolerated food without any problems? Yes.    Have you been able to return to your normal activities? Yes.    Do you have any questions about your discharge instructions: Diet   No. Medications  No. Follow up visit  No.  Do you have questions or concerns about your Care? No.  Actions: * If pain score is 4 or above: No action needed, pain <4.

## 2022-10-08 LAB — SURGICAL PATHOLOGY

## 2022-10-11 ENCOUNTER — Encounter: Payer: Managed Care, Other (non HMO) | Admitting: Gastroenterology

## 2022-11-16 IMAGING — CT CT MAXILLOFACIAL W/O CM
3 of 5 series · 13 of 47 positions shown, 15 images · non-contrast
Comparison: None.

CLINICAL DATA: Chronic maxillary sinusitis



[Series 2: standard · axial · 0.49mm/px · z∈[-635,-530]mm · 8 of 123 slices shown, 10 images]
[im 9/123  brain]
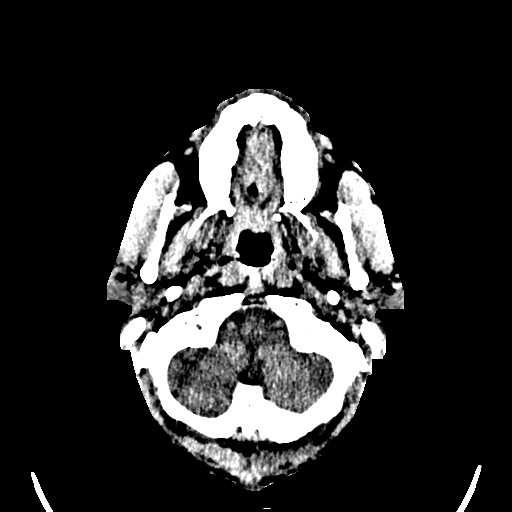
[im 9/123  bone]
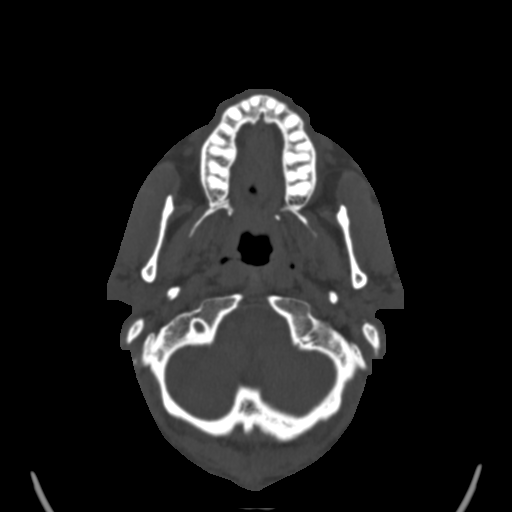
[im 26/123  bone]
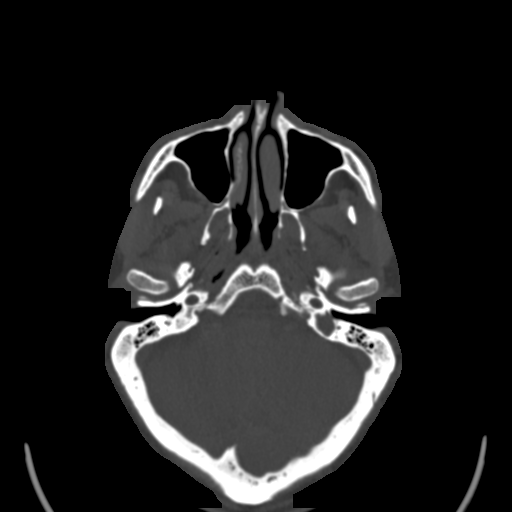
[im 38/123  bone]
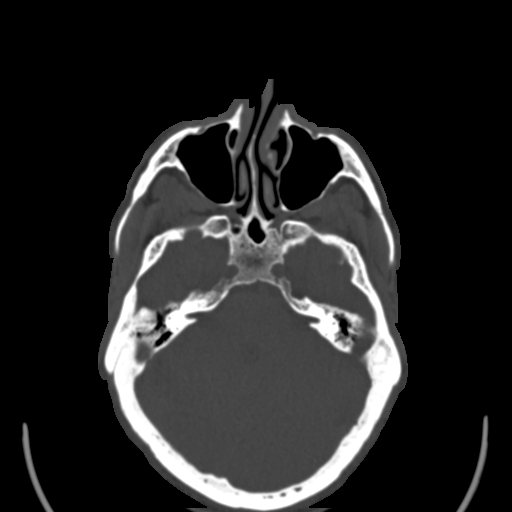
[im 55/123  bone]
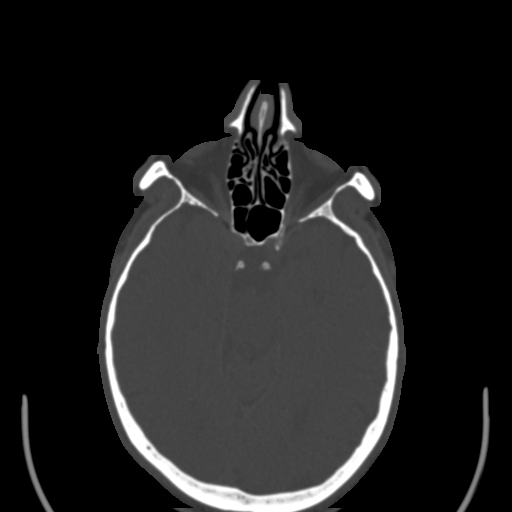
[im 68/123  brain]
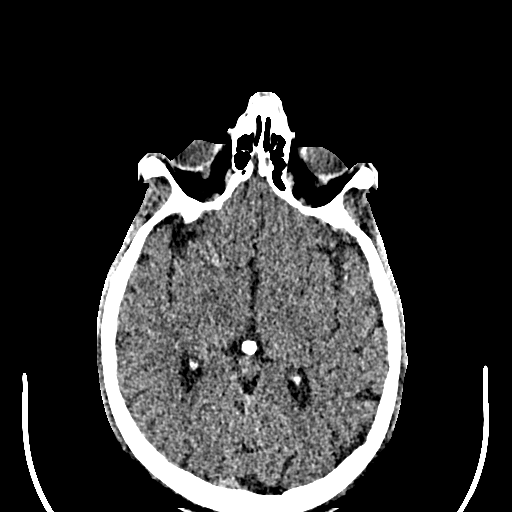
[im 68/123  bone]
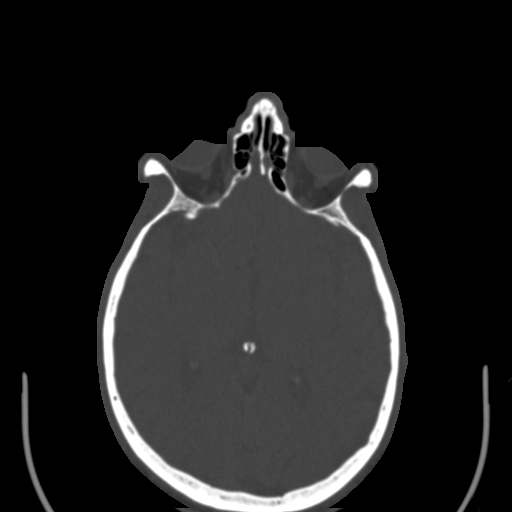
[im 85/123  bone]
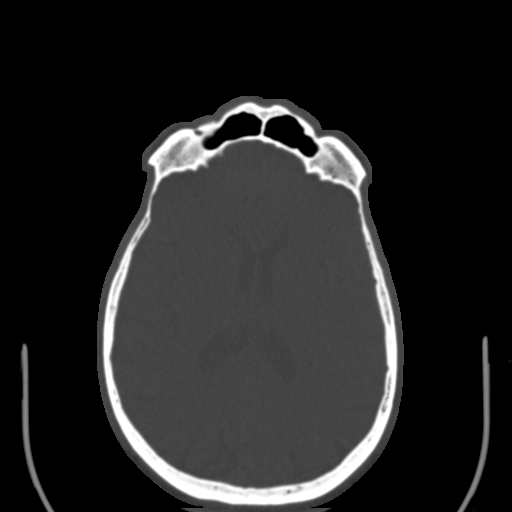
[im 97/123  bone]
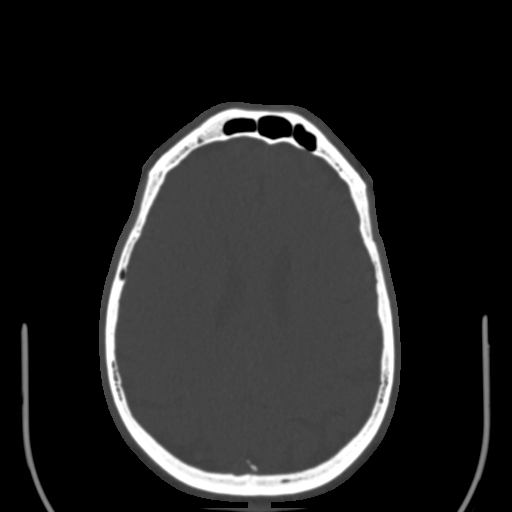
[im 114/123  bone]
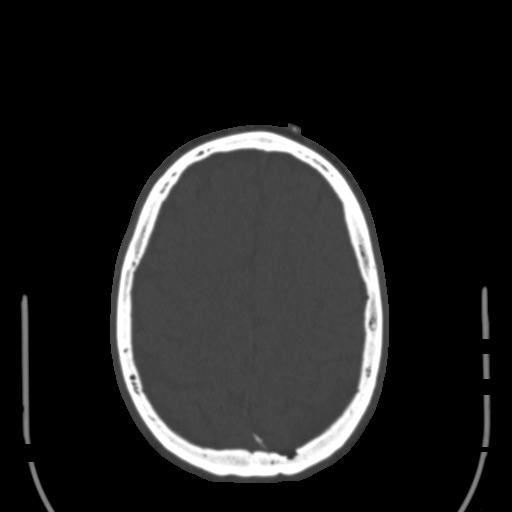

[Series 6: cor bone · coronal · 0.25mm/px · 3 of 101 slices shown]
[im 26/101  bone]
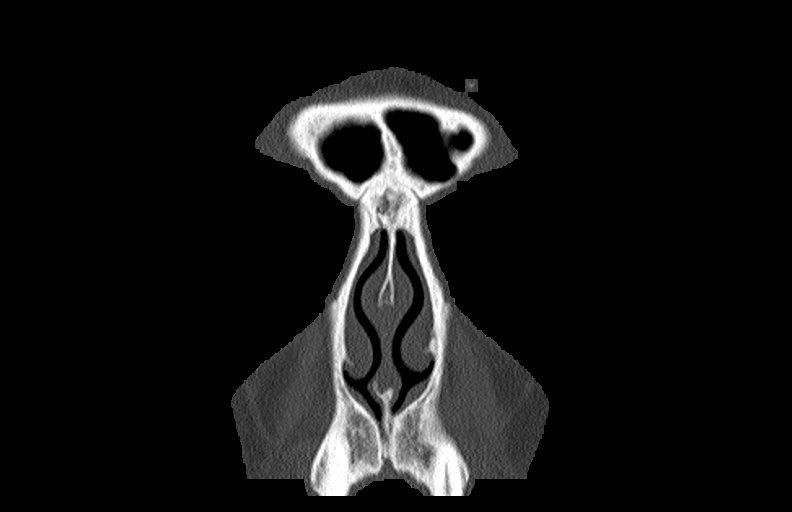
[im 51/101  bone]
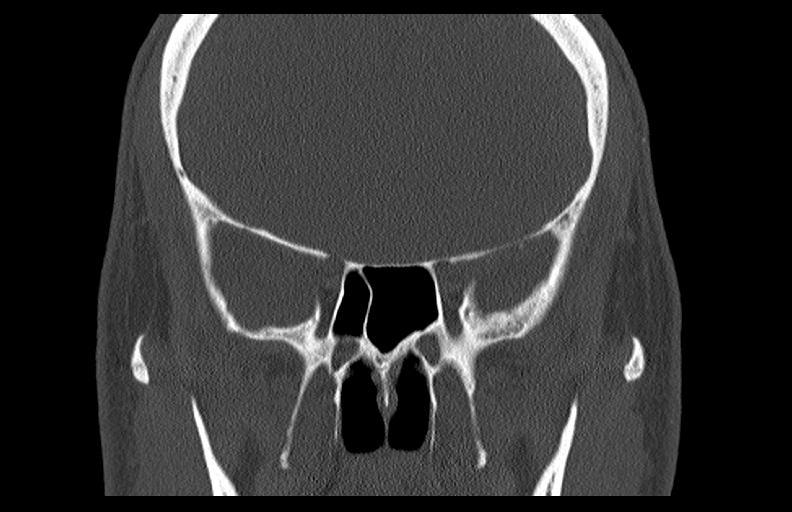
[im 76/101  bone]
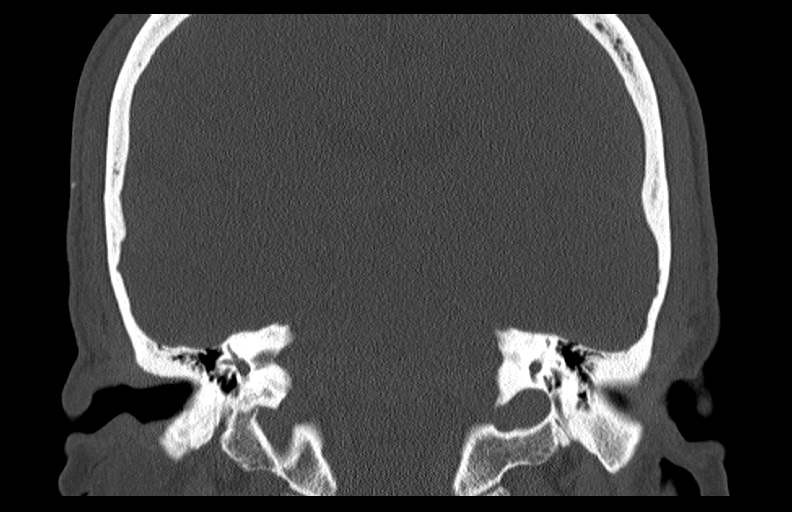

[Series 7: sag bone · sagittal · 0.29mm/px · 2 of 101 slices shown]
[im 34/101  bone]
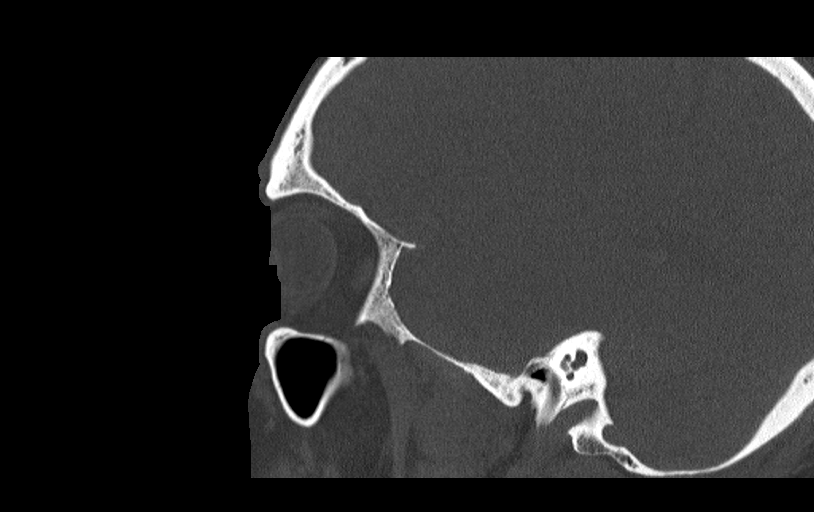
[im 67/101  bone]
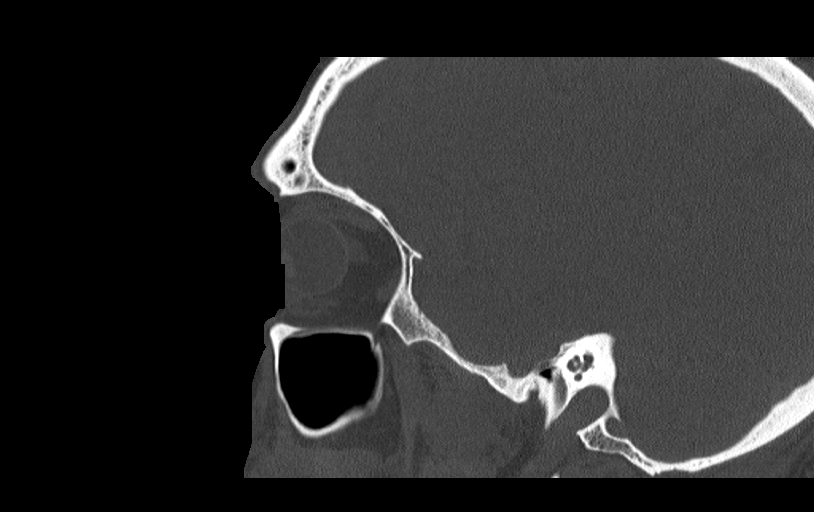

[13 of 47 positions shown; findings below may reference images not displayed]

FINDINGS: Paranasal sinuses:

Frontal: Normally aerated. Patent frontal sinus drainage pathways.

Ethmoid: Minor mucosal thickening.

Maxillary: Normally aerated.

Sphenoid: Normally aerated. Patent sphenoethmoidal recesses.

Right ostiomeatal unit: Patent.

Left ostiomeatal unit: Patent.

Nasal passages: Patent. Intact nasal septum is deviated to the right
with a small bony spur.

Anatomy: Cribriform plate and fovea ethmoidalis are intact.
Presellar sphenoid pneumatization pattern. No dehiscence of carotid
or optic canals. No onodi cell.

Other: Mastoid air cells are clear. Temporomandibular joints are
unremarkable. Limited intracranial imaging demonstrates no acute
abnormality.
IMPRESSION: No significant paranasal sinus inflammatory changes. Drainage
pathways are patent.

Mild deviation of the nasal septum.

## 2022-12-14 ENCOUNTER — Ambulatory Visit: Payer: Managed Care, Other (non HMO) | Admitting: Urology

## 2023-01-25 ENCOUNTER — Ambulatory Visit: Payer: Managed Care, Other (non HMO) | Admitting: Urology

## 2024-03-04 ENCOUNTER — Ambulatory Visit: Admitting: Gastroenterology

## 2024-03-17 ENCOUNTER — Ambulatory Visit: Admitting: Gastroenterology
